# Patient Record
Sex: Female | Born: 1935 | Race: White | Hispanic: No | Smoking: Former smoker
Health system: Southern US, Community
[De-identification: ages and names within clinical notes are randomized; demographics above are authoritative.]

## PROBLEM LIST (undated history)

## (undated) DIAGNOSIS — E7800 Pure hypercholesterolemia, unspecified: Secondary | ICD-10-CM

## (undated) DIAGNOSIS — F329 Major depressive disorder, single episode, unspecified: Secondary | ICD-10-CM

## (undated) DIAGNOSIS — I4891 Unspecified atrial fibrillation: Secondary | ICD-10-CM

## (undated) DIAGNOSIS — I10 Essential (primary) hypertension: Secondary | ICD-10-CM

## (undated) DIAGNOSIS — I499 Cardiac arrhythmia, unspecified: Secondary | ICD-10-CM

## (undated) DIAGNOSIS — H3530 Unspecified macular degeneration: Secondary | ICD-10-CM

## (undated) DIAGNOSIS — F32A Depression, unspecified: Secondary | ICD-10-CM

## (undated) DIAGNOSIS — K219 Gastro-esophageal reflux disease without esophagitis: Secondary | ICD-10-CM

## (undated) DIAGNOSIS — I639 Cerebral infarction, unspecified: Secondary | ICD-10-CM

## (undated) DIAGNOSIS — G459 Transient cerebral ischemic attack, unspecified: Secondary | ICD-10-CM

## (undated) HISTORY — DX: Unspecified macular degeneration: H35.30

## (undated) HISTORY — DX: Unspecified atrial fibrillation: I48.91

## (undated) HISTORY — DX: Transient cerebral ischemic attack, unspecified: G45.9

---

## 2010-03-26 ENCOUNTER — Ambulatory Visit (HOSPITAL_COMMUNITY): Admission: RE | Admit: 2010-03-26 | Discharge: 2010-03-26 | Payer: Self-pay | Admitting: Ophthalmology

## 2010-04-30 ENCOUNTER — Ambulatory Visit (HOSPITAL_COMMUNITY): Admission: RE | Admit: 2010-04-30 | Discharge: 2010-04-30 | Payer: Self-pay | Admitting: Ophthalmology

## 2010-10-08 LAB — GLUCOSE, CAPILLARY: Glucose-Capillary: 130 mg/dL — ABNORMAL HIGH (ref 70–99)

## 2010-10-09 LAB — HEMOGLOBIN AND HEMATOCRIT, BLOOD
HCT: 41.5 % (ref 36.0–46.0)
Hemoglobin: 14.3 g/dL (ref 12.0–15.0)

## 2010-10-09 LAB — BASIC METABOLIC PANEL
BUN: 8 mg/dL (ref 6–23)
CO2: 23 mEq/L (ref 19–32)
Calcium: 9.2 mg/dL (ref 8.4–10.5)
Creatinine, Ser: 0.95 mg/dL (ref 0.4–1.2)
GFR calc Af Amer: >60 mL/min (ref >60)

## 2010-11-01 ENCOUNTER — Inpatient Hospital Stay (INDEPENDENT_AMBULATORY_CARE_PROVIDER_SITE_OTHER)
Admission: RE | Admit: 2010-11-01 | Discharge: 2010-11-01 | Disposition: A | Discharge status: Home / Self Care | Payer: Medicare Other | Source: Ambulatory Visit | Attending: Emergency Medicine | Admitting: Emergency Medicine

## 2010-11-01 DIAGNOSIS — I10 Essential (primary) hypertension: Secondary | ICD-10-CM

## 2010-11-01 DIAGNOSIS — J069 Acute upper respiratory infection, unspecified: Secondary | ICD-10-CM

## 2011-07-17 ENCOUNTER — Emergency Department (INDEPENDENT_AMBULATORY_CARE_PROVIDER_SITE_OTHER)
Admission: EM | Admit: 2011-07-17 | Discharge: 2011-07-17 | Disposition: A | Discharge status: Home / Self Care | Payer: Medicare Other | Source: Home / Self Care | Attending: Family Medicine | Admitting: Family Medicine

## 2011-07-17 ENCOUNTER — Encounter: Payer: Self-pay | Admitting: *Deleted

## 2011-07-17 ENCOUNTER — Emergency Department (INDEPENDENT_AMBULATORY_CARE_PROVIDER_SITE_OTHER): Payer: Medicare Other

## 2011-07-17 DIAGNOSIS — J069 Acute upper respiratory infection, unspecified: Secondary | ICD-10-CM

## 2011-07-17 HISTORY — DX: Cardiac arrhythmia, unspecified: I49.9

## 2011-07-17 HISTORY — DX: Essential (primary) hypertension: I10

## 2011-07-17 HISTORY — DX: Gastro-esophageal reflux disease without esophagitis: K21.9

## 2011-07-17 HISTORY — DX: Pure hypercholesterolemia, unspecified: E78.00

## 2011-07-17 HISTORY — DX: Depression, unspecified: F32.A

## 2011-07-17 HISTORY — DX: Cerebral infarction, unspecified: I63.9

## 2011-07-17 HISTORY — DX: Major depressive disorder, single episode, unspecified: F32.9

## 2011-07-17 MED ORDER — BENZONATATE 100 MG PO CAPS: 100.0000 mg | ORAL_CAPSULE | Freq: Three times a day (TID) | ORAL | Status: AC

## 2011-07-17 MED ORDER — ACETAMINOPHEN-CODEINE 120-12 MG/5ML PO SUSP: 5.0000 mL | Freq: Three times a day (TID) | ORAL | Status: AC | PRN

## 2011-07-17 NOTE — ED Provider Notes (Signed)
History     CSN: [id number]  Arrival date & time 07/17/11  1157   First MD Initiated Contact with Patient 07/17/11 1218      Chief Complaint  Patient presents with  . Cough  . Nasal Congestion    (Consider location/radiation/quality/duration/timing/severity/associated sxs/prior treatment) HPI Comments: 77 y/o female with h/o well controlled HTN, DM comes c/o of 3 days with nasal congestion and dry cough, sister also here with similar symptoms they are planing to travel by car (somebody else driving) to visit family for the holidays and wanted to be checked. Denies, fever, shortness of breath or chest pain. Good appetite drinking fluids well. Otherwise feels well. Had her flu shot this year.    Past Medical History  Diagnosis Date  . Hypertension   . Diabetes mellitus   . Irregular heart rate   . High cholesterol   . Depression   . Acid reflux   . Stroke     History reviewed. No pertinent past surgical history.  History reviewed. No pertinent family history.  History  Substance Use Topics  . Smoking status: Never Smoker   . Smokeless tobacco: Not on file  . Alcohol Use: No    OB History    Grav Para Term Preterm Abortions TAB SAB Ect Mult Living                  Review of Systems  Constitutional: Negative.   HENT: Positive for congestion and rhinorrhea. Negative for ear pain, sore throat, trouble swallowing, neck pain, voice change and sinus pressure.   Eyes: Negative for discharge.  Respiratory: Positive for cough. Negative for chest tightness, shortness of breath and wheezing.   Cardiovascular: Negative for chest pain, palpitations and leg swelling.  Gastrointestinal: Negative for nausea, vomiting, abdominal pain and diarrhea.  Musculoskeletal: Negative for myalgias and arthralgias.  Skin: Negative for rash.  Neurological: Negative for dizziness and headaches.    Allergies  Review of patient's allergies indicates no known allergies.  Home Medications    Current Outpatient Rx  Name Route Sig Dispense Refill  . ATORVASTATIN CALCIUM 10 MG PO TABS Oral Take 10 mg by mouth 1 day or 1 dose.      . OCUVITE PO TABS Oral Take 1 tablet by mouth daily.      Marland Kitchen CLOPIDOGREL BISULFATE 75 MG PO TABS Oral Take 75 mg by mouth daily.      Marland Kitchen DIGOXIN 0.25 MG PO TABS Oral Take 250 mcg by mouth daily.      . ENALAPRIL MALEATE 5 MG PO TABS Oral Take 5 mg by mouth daily.      Marland Kitchen GLUCOSAMINE-CHONDROITIN 500-400 MG PO TABS Oral Take 1 tablet by mouth 2 (two) times daily with a meal.      . METFORMIN HCL 500 MG PO TABS Oral Take 250 mg by mouth 2 (two) times daily with a meal.      . CENTRUM SILVER ULTRA WOMENS PO Oral Take by mouth.      . OMEPRAZOLE 20 MG PO CPDR Oral Take 20 mg by mouth daily.      Marland Kitchen OVER THE COUNTER MEDICATION  Took x one     . PAROXETINE HCL 20 MG PO TABS Oral Take 20 mg by mouth every morning.      . ACETAMINOPHEN-CODEINE 120-12 MG/5ML PO SUSP Oral Take 5 mLs by mouth every 8 (eight) hours as needed for pain (can use for cough). 60 mL 0  . BENZONATATE 100 MG  PO CAPS Oral Take 1 capsule (100 mg total) by mouth every 8 (eight) hours. 21 capsule 0    BP 129/89  Pulse 96  Temp(Src) 99.2 F (37.3 C) (Oral)  Resp 20  SpO2 98%  Physical Exam  Nursing note and vitals reviewed. Constitutional: She is oriented to person, place, and time. She appears well-developed and well-nourished. No distress.  HENT:  Head: Normocephalic and atraumatic.  Right Ear: External ear normal.  Left Ear: External ear normal.  Mouth/Throat: Oropharynx is clear and moist.       Mild nasal congestion with clear rhinorrhea, nasal voice. No sinus tenderness.  Eyes: Conjunctivae and EOM are normal. Pupils are equal, round, and reactive to light. Right eye exhibits no discharge. Left eye exhibits no discharge.  Neck: Neck supple. No JVD present.  Cardiovascular: Normal rate, regular rhythm and normal heart sounds.  Exam reveals no gallop and no friction rub.    Pulmonary/Chest: Effort normal and breath sounds normal. No respiratory distress. She has no wheezes. She has no rales. She exhibits no tenderness.  Musculoskeletal: She exhibits no edema.  Lymphadenopathy:    She has no cervical adenopathy.  Neurological: She is alert and oriented to person, place, and time.  Skin: No rash noted.    ED Course  Procedures (including critical care time)  Labs Reviewed - No data to display Dg Chest 2 View  07/17/2011  *RADIOLOGY REPORT*  Clinical Data: Cough and congestion  CHEST - 2 VIEW  Comparison:  none  Findings: Normal mediastinum and cardiac silhouette.  Normal pulmonary  vasculature.  No evidence of effusion, infiltrate, or pneumothorax.  No acute bony abnormality.Degenerative osteophytosis of the thoracic spine.  IMPRESSION: No acute cardiopulmonary process.  Original Report Authenticated By: Genevive Bi, M.D.     1. URI (upper respiratory infection)       MDM  Normal chest X-ray. likely viral URI. Non toxic, reassuring clinical exam. Treated symptomatically.        Sharin Grave, MD 07/17/11 2026

## 2011-07-17 NOTE — ED Notes (Signed)
Pt with c/o cough congestion onset x 3 days worse yesterday - dry hacking cough -

## 2013-07-09 ENCOUNTER — Ambulatory Visit
Admission: RE | Admit: 2013-07-09 | Discharge: 2013-07-09 | Disposition: A | Discharge status: Home / Self Care | Payer: Medicare Other | Source: Ambulatory Visit | Attending: Family Medicine | Admitting: Family Medicine

## 2013-07-09 ENCOUNTER — Other Ambulatory Visit: Payer: Self-pay | Admitting: Family Medicine

## 2013-07-09 DIAGNOSIS — M545 Low back pain: Secondary | ICD-10-CM

## 2013-08-03 ENCOUNTER — Other Ambulatory Visit: Payer: Self-pay | Admitting: Specialist

## 2013-08-03 DIAGNOSIS — N9489 Other specified conditions associated with female genital organs and menstrual cycle: Secondary | ICD-10-CM

## 2013-08-07 ENCOUNTER — Ambulatory Visit
Admission: RE | Admit: 2013-08-07 | Discharge: 2013-08-07 | Disposition: A | Discharge status: Home / Self Care | Payer: Medicare Other | Source: Ambulatory Visit | Attending: Specialist | Admitting: Specialist

## 2013-08-07 DIAGNOSIS — N9489 Other specified conditions associated with female genital organs and menstrual cycle: Secondary | ICD-10-CM

## 2013-08-23 ENCOUNTER — Other Ambulatory Visit: Payer: Medicare Other

## 2015-02-17 ENCOUNTER — Other Ambulatory Visit: Payer: Self-pay | Admitting: Family Medicine

## 2015-02-17 ENCOUNTER — Ambulatory Visit
Admission: RE | Admit: 2015-02-17 | Discharge: 2015-02-17 | Disposition: A | Discharge status: Home / Self Care | Payer: Medicare Other | Source: Ambulatory Visit | Attending: Family Medicine | Admitting: Family Medicine

## 2015-02-17 DIAGNOSIS — M25571 Pain in right ankle and joints of right foot: Secondary | ICD-10-CM

## 2017-06-11 ENCOUNTER — Encounter (HOSPITAL_COMMUNITY): Payer: Self-pay | Admitting: Emergency Medicine

## 2017-06-11 ENCOUNTER — Other Ambulatory Visit: Payer: Self-pay

## 2017-06-11 ENCOUNTER — Emergency Department (HOSPITAL_COMMUNITY)
Admission: EM | Admit: 2017-06-11 | Discharge: 2017-06-11 | Disposition: A | Discharge status: Home / Self Care | Payer: Medicare Other | Attending: Physician Assistant | Admitting: Physician Assistant

## 2017-06-11 ENCOUNTER — Emergency Department (HOSPITAL_BASED_OUTPATIENT_CLINIC_OR_DEPARTMENT_OTHER): Payer: Medicare Other

## 2017-06-11 ENCOUNTER — Ambulatory Visit (HOSPITAL_COMMUNITY)
Admission: EM | Admit: 2017-06-11 | Discharge: 2017-06-11 | Disposition: A | Discharge status: Home / Self Care | Payer: Medicare Other | Source: Home / Self Care | Attending: Family Medicine | Admitting: Family Medicine

## 2017-06-11 DIAGNOSIS — E119 Type 2 diabetes mellitus without complications: Secondary | ICD-10-CM | POA: Diagnosis not present

## 2017-06-11 DIAGNOSIS — Z7984 Long term (current) use of oral hypoglycemic drugs: Secondary | ICD-10-CM | POA: Diagnosis not present

## 2017-06-11 DIAGNOSIS — F329 Major depressive disorder, single episode, unspecified: Secondary | ICD-10-CM | POA: Diagnosis not present

## 2017-06-11 DIAGNOSIS — L03115 Cellulitis of right lower limb: Secondary | ICD-10-CM | POA: Diagnosis not present

## 2017-06-11 DIAGNOSIS — Z8673 Personal history of transient ischemic attack (TIA), and cerebral infarction without residual deficits: Secondary | ICD-10-CM | POA: Insufficient documentation

## 2017-06-11 DIAGNOSIS — R600 Localized edema: Secondary | ICD-10-CM

## 2017-06-11 DIAGNOSIS — R21 Rash and other nonspecific skin eruption: Secondary | ICD-10-CM | POA: Insufficient documentation

## 2017-06-11 DIAGNOSIS — R609 Edema, unspecified: Secondary | ICD-10-CM

## 2017-06-11 DIAGNOSIS — Z7982 Long term (current) use of aspirin: Secondary | ICD-10-CM | POA: Insufficient documentation

## 2017-06-11 DIAGNOSIS — R2243 Localized swelling, mass and lump, lower limb, bilateral: Secondary | ICD-10-CM | POA: Diagnosis not present

## 2017-06-11 DIAGNOSIS — I10 Essential (primary) hypertension: Secondary | ICD-10-CM | POA: Diagnosis not present

## 2017-06-11 DIAGNOSIS — Z87891 Personal history of nicotine dependence: Secondary | ICD-10-CM | POA: Insufficient documentation

## 2017-06-11 DIAGNOSIS — Z7902 Long term (current) use of antithrombotics/antiplatelets: Secondary | ICD-10-CM | POA: Insufficient documentation

## 2017-06-11 DIAGNOSIS — Z79899 Other long term (current) drug therapy: Secondary | ICD-10-CM | POA: Diagnosis not present

## 2017-06-11 MED ORDER — PREDNISONE 20 MG PO TABS: ORAL_TABLET | ORAL | 0 refills | Status: DC

## 2017-06-11 MED ORDER — TRIAMCINOLONE ACETONIDE 0.1 % EX CREA
TOPICAL_CREAM | Freq: Two times a day (BID) | CUTANEOUS | Status: DC
  Administered 2017-06-11: 20:00:00 via TOPICAL
  Filled 2017-06-11: qty 15

## 2017-06-11 MED ORDER — PREDNISONE 20 MG PO TABS
40.0000 mg | ORAL_TABLET | Freq: Once | ORAL | Status: AC
  Administered 2017-06-11: 40 mg via ORAL
  Filled 2017-06-11: qty 2

## 2017-06-11 NOTE — ED Notes (Signed)
Vascular tech at bedside. °

## 2017-06-11 NOTE — Progress Notes (Signed)
VASCULAR LAB PRELIMINARY  PRELIMINARY  PRELIMINARY  PRELIMINARY  Bilateral lower extremity venous duplex completed.    Preliminary report:  There is no obvious evidence of DVT or SVT noted in the visualized veins of the bilateral lower extremities.  There are enlarged inguinal lymph nodes noted bilaterally.  Gave report to Dr. Rubin PayorPickering and patient's RN.  Morgan Ross, RVT 06/11/2017, 7:18 PM

## 2017-06-11 NOTE — Discharge Instructions (Signed)
Please go to the ED so they can evaluate the leg and rule out a blood clot and treat for possible infection

## 2017-06-11 NOTE — ED Triage Notes (Addendum)
Rash on trunk and extremities for a month.  Swelling in lower extremities and redness noted last night.  Rash does itch.  Right ankle particularly bad  Right lower leg larger than left lower leg

## 2017-06-11 NOTE — ED Triage Notes (Signed)
Pt presents to ED for assessment of bilateral leg and arm swelling with redness, cellulitis, and flaky skin.  Pt states all symptoms have been going on x 1 month, c/o worsening, especially with itching.

## 2017-06-11 NOTE — ED Provider Notes (Signed)
[facility]   CSN: [id number] Arrival date & time: 06/11/17  1622     History   Chief Complaint Chief Complaint  Patient presents with  . Leg Swelling  . Rash    HPI Morgan Ross is a 83 y.o. female.  HPI  Patient is a 83 year old female presenting with rash for the last 9 months.  Patient went to urgent care today and was sent here to rule out DVT.  Patient has a rash to bilateral upper extremities lower extremities and back.  Patient reports is itchy in nature.  No systemic symptoms.  No fevers no nausea no vomiting no change in weight.  She sees a primary care physician regularly every 6 months.  Patient denies diabetes  Family member reports that they have tried multiple over-the-counter things for different amount of time but have not noticed anything that helps.  Past Medical History:  Diagnosis Date  . Acid reflux   . Atrial fibrillation (HCC)   . Depression   . Diabetes mellitus   . High cholesterol   . Hypertension   . Irregular heart rate   . Macular degeneration   . Memory changes   . Stroke (HCC)   . TIA (transient ischemic attack)     There are no active problems to display for this patient.   History reviewed. No pertinent surgical history.  OB History    No data available       Home Medications    Prior to Admission medications   Medication Sig Start Date End Date Taking? Authorizing Provider  aspirin 81 MG tablet Take 81 mg by mouth daily.    [provider]  atorvastatin (LIPITOR) 10 MG tablet Take 10 mg by mouth 1 day or 1 dose.      [provider]  clopidogrel (PLAVIX) 75 MG tablet Take 75 mg by mouth daily.      [provider]  digoxin (LANOXIN) 0.25 MG tablet Take 125 mcg by mouth daily.     [provider]  donepezil (ARICEPT) 10 MG tablet Take 10 mg by mouth at bedtime.    [provider]  enalapril (VASOTEC) 5 MG tablet Take 5  mg by mouth 2 (two) times daily.     [provider]  glucosamine-chondroitin 500-400 MG tablet Take 1 tablet by mouth 2 (two) times daily with a meal.      [provider]  LOSARTAN POTASSIUM PO Take by mouth.    [provider]  Memantine HCl-Donepezil HCl (NAMZARIC PO) Take by mouth.    [provider]  metFORMIN (GLUCOPHAGE) 500 MG tablet Take 250 mg by mouth 2 (two) times daily with a meal.      [provider]  Multiple Vitamins-Minerals (CENTRUM SILVER ULTRA WOMENS PO) Take by mouth.      [provider]  omeprazole (PRILOSEC) 20 MG capsule Take 20 mg by mouth daily.      [provider]  PARoxetine (PAXIL) 20 MG tablet Take 20 mg by mouth every morning.      [provider]    Family History Family History  Problem Relation Age of Onset  . Hypertension Sister   . Hypertension Sister     Social History Social History   Tobacco Use  . Smoking status: Former Games developermoker  . Smokeless tobacco: Never Used  Substance Use Topics  . Alcohol use: No  . Drug use: No  Allergies   Patient has no known allergies.   Review of Systems Review of Systems  Constitutional: Negative for activity change.  Respiratory: Negative for shortness of breath.   Cardiovascular: Negative for chest pain.  Gastrointestinal: Negative for abdominal pain.     Physical Exam Updated Vital Signs BP (!) 165/77   Pulse 77   Temp 98 F (36.7 C) (Oral)   Resp 16   SpO2 100%   Physical Exam  Constitutional: She is oriented to person, place, and time. She appears well-developed and well-nourished.  HENT:  Head: Normocephalic and atraumatic.  Eyes: Right eye exhibits no discharge.  Cardiovascular: Normal rate.  Pulmonary/Chest: Effort normal and breath sounds normal. She has no wheezes.  Neurological: She is oriented to person, place, and time.  Skin: Skin is warm and dry. She is not diaphoretic.  Multiple areas of  erythematous base with scaly top, clear excoriations.  Psychiatric: She has a normal mood and affect.  Nursing note and vitals reviewed.        ED Treatments / Results  Labs (all labs ordered are listed, but only abnormal results are displayed) Labs Reviewed - No data to display  EKG  EKG Interpretation None       Radiology No results found.  Procedures Procedures (including critical care time)  Medications Ordered in ED Medications - No data to display   Initial Impression / Assessment and Plan / ED Course  I have reviewed the triage vital signs and the nursing notes.  Pertinent labs & imaging results that were available during my care of the patient were reviewed by me and considered in my medical decision making (see chart for details).     Patient is a 83 year old female presenting with rash for the last 9 months.  Patient went to urgent care today and was sent here to rule out DVT.  Patient has a rash to bilateral upper extremities lower extremities and back.  Patient reports is itchy in nature.  No systemic symptoms.  No fevers no nausea no vomiting no change in weight.  She sees a primary care physician regularly every 6 months.  Patient denies diabetes  Family member reports that they have tried multiple over-the-counter things for different amount of time but have not noticed anything that helps.  7:35 PM This rash appears very chronic.  We will give a mild steroid burst, patient c says that she does not have any diabetes.  Will give topical steroid as well.  We will have her follow-up with her primary care physician.  Told family members to have to call dermatology on Monday morning.  Looks like an  dermatitis, however there are other concerns in this age group such as cutaneous lymphoma etc.  Final Clinical Impressions(s) / ED Diagnoses   Final diagnoses:  None    ED Discharge Orders    None       Abelino DerrickMackuen, Jackqulyn Mendel Lyn, MD 06/11/17 1936

## 2017-06-11 NOTE — Discharge Instructions (Signed)
Please take the steroids to help with your rash.  Is important to note that these medications can make your glucose high, so keep a close watch.  In addition you can use topical medication on your skin.  It is important to make a follow-up with your primary care physician in the next week or 2.  In addition you need to make an appointment with dermatologist as soon as possible.

## 2017-06-11 NOTE — ED Provider Notes (Signed)
[facility]    CSN: [id number] Arrival date & time: 06/11/17  1459     History   Chief Complaint Chief Complaint  Patient presents with  . Leg Swelling    HPI Morgan Ross is a 83 y.o. female.   Who presents with severe rash, redness, pain and swelling of her right leg. Onset rash x 1 month, redness and swelling of the right lower leg x 2 days per family member. Denies dysnpnea or chest pain. No new medications. No fevers or chills.       Past Medical History:  Diagnosis Date  . Acid reflux   . Atrial fibrillation (HCC)   . Depression   . Diabetes mellitus   . High cholesterol   . Hypertension   . Irregular heart rate   . Macular degeneration   . Memory changes   . Stroke (HCC)   . TIA (transient ischemic attack)     There are no active problems to display for this patient.   History reviewed. No pertinent surgical history.  OB History    No data available       Home Medications    Prior to Admission medications   Medication Sig Start Date End Date Taking? Authorizing Provider  LOSARTAN POTASSIUM PO Take by mouth.   Yes [provider]  Memantine HCl-Donepezil HCl (NAMZARIC PO) Take by mouth.   Yes [provider]  aspirin 81 MG tablet Take 81 mg by mouth daily.    [provider]  atorvastatin (LIPITOR) 10 MG tablet Take 10 mg by mouth 1 day or 1 dose.      [provider]  clopidogrel (PLAVIX) 75 MG tablet Take 75 mg by mouth daily.      [provider]  digoxin (LANOXIN) 0.25 MG tablet Take 125 mcg by mouth daily.     [provider]  donepezil (ARICEPT) 10 MG tablet Take 10 mg by mouth at bedtime.    [provider]  enalapril (VASOTEC) 5 MG tablet Take 5 mg by mouth 2 (two) times daily.     [provider]  glucosamine-chondroitin 500-400 MG tablet Take 1 tablet by mouth 2 (two) times daily with a meal.      [provider]  metFORMIN (GLUCOPHAGE) 500  MG tablet Take 250 mg by mouth 2 (two) times daily with a meal.      [provider]  Multiple Vitamins-Minerals (CENTRUM SILVER ULTRA WOMENS PO) Take by mouth.      [provider]  omeprazole (PRILOSEC) 20 MG capsule Take 20 mg by mouth daily.      [provider]  PARoxetine (PAXIL) 20 MG tablet Take 20 mg by mouth every morning.      [provider]    Family History Family History  Problem Relation Age of Onset  . Hypertension Sister   . Hypertension Sister     Social History Social History   Tobacco Use  . Smoking status: Former Smoker  Substance Use Topics  . Alcohol use: No  . Drug use: No     Allergies   Patient has no known allergies.   Review of Systems Review of Systems   Physical Exam Triage Vital Signs ED Triage Vitals  Enc Vitals Group     BP      Pulse      Resp      Temp      Temp src  SpO2      Weight      Height      Head Circumference      Peak Flow      Pain Score      Pain Loc      Pain Edu?      Excl. in GC?    No data found.  Updated Vital Signs There were no vitals taken for this visit.  Visual Acuity Right Eye Distance:   Left Eye Distance:   Bilateral Distance:    Right Eye Near:   Left Eye Near:    Bilateral Near:     Physical Exam  Constitutional: Morgan Ross appears well-developed and well-nourished.  Musculoskeletal: Morgan Ross exhibits edema.  Neurological: Morgan Ross is alert.  Skin: Skin is warm. Rash noted. There is erythema.  Psychiatric:  Right lower leg and calf with moderate edema when compared to left leg, erythema overlying erythematous thick scaly rash with excoriations throughout entire body. No pain to palpation of the calf and negative Homan's  Nursing note and vitals reviewed.    UC Treatments / Results  Labs (all labs ordered are listed, but only abnormal results are displayed) Labs Reviewed - No data to display  EKG  EKG Interpretation None       Radiology No  results found.  Procedures Procedures (including critical care time)  Medications Ordered in UC Medications - No data to display   Initial Impression / Assessment and Plan / UC Course  I have reviewed the triage vital signs and the nursing notes.  Pertinent labs & imaging results that were available during my care of the patient were reviewed by me and considered in my medical decision making (see chart for details).     83 yo with asymmetric calf/LE swelling, erythema and severe rash. In theory needs a doppler and further evaluation. Will send to the ED for further evaluation.   Final Clinical Impressions(s) / UC Diagnoses   Final diagnoses:  None    ED Discharge Orders    None       Controlled Substance Prescriptions [place] Controlled Substance Registry consulted? Not Applicable   Riki SheerYoung, Mahogony Gilchrest G, PA-C 06/11/17 1551

## 2017-08-09 ENCOUNTER — Emergency Department (HOSPITAL_COMMUNITY): Payer: Medicare Other

## 2017-08-09 ENCOUNTER — Observation Stay (HOSPITAL_COMMUNITY)
Admission: EM | Admit: 2017-08-09 | Discharge: 2017-08-13 | Disposition: A | Discharge status: Skilled Nursing Facility | Payer: Medicare Other | Attending: Internal Medicine | Admitting: Internal Medicine

## 2017-08-09 DIAGNOSIS — B9620 Unspecified Escherichia coli [E. coli] as the cause of diseases classified elsewhere: Secondary | ICD-10-CM | POA: Insufficient documentation

## 2017-08-09 DIAGNOSIS — K219 Gastro-esophageal reflux disease without esophagitis: Secondary | ICD-10-CM | POA: Insufficient documentation

## 2017-08-09 DIAGNOSIS — E7800 Pure hypercholesterolemia, unspecified: Secondary | ICD-10-CM | POA: Insufficient documentation

## 2017-08-09 DIAGNOSIS — Z87891 Personal history of nicotine dependence: Secondary | ICD-10-CM | POA: Diagnosis not present

## 2017-08-09 DIAGNOSIS — Z8673 Personal history of transient ischemic attack (TIA), and cerebral infarction without residual deficits: Secondary | ICD-10-CM | POA: Insufficient documentation

## 2017-08-09 DIAGNOSIS — I6782 Cerebral ischemia: Secondary | ICD-10-CM | POA: Diagnosis not present

## 2017-08-09 DIAGNOSIS — R296 Repeated falls: Secondary | ICD-10-CM | POA: Diagnosis not present

## 2017-08-09 DIAGNOSIS — I10 Essential (primary) hypertension: Secondary | ICD-10-CM

## 2017-08-09 DIAGNOSIS — E119 Type 2 diabetes mellitus without complications: Secondary | ICD-10-CM | POA: Insufficient documentation

## 2017-08-09 DIAGNOSIS — Z66 Do not resuscitate: Secondary | ICD-10-CM | POA: Insufficient documentation

## 2017-08-09 DIAGNOSIS — R531 Weakness: Secondary | ICD-10-CM | POA: Diagnosis not present

## 2017-08-09 DIAGNOSIS — N390 Urinary tract infection, site not specified: Secondary | ICD-10-CM | POA: Insufficient documentation

## 2017-08-09 DIAGNOSIS — J322 Chronic ethmoidal sinusitis: Secondary | ICD-10-CM | POA: Diagnosis not present

## 2017-08-09 DIAGNOSIS — R29898 Other symptoms and signs involving the musculoskeletal system: Secondary | ICD-10-CM

## 2017-08-09 DIAGNOSIS — G3189 Other specified degenerative diseases of nervous system: Secondary | ICD-10-CM | POA: Diagnosis not present

## 2017-08-09 DIAGNOSIS — I4891 Unspecified atrial fibrillation: Secondary | ICD-10-CM | POA: Diagnosis not present

## 2017-08-09 DIAGNOSIS — F329 Major depressive disorder, single episode, unspecified: Secondary | ICD-10-CM | POA: Insufficient documentation

## 2017-08-09 DIAGNOSIS — Z7982 Long term (current) use of aspirin: Secondary | ICD-10-CM | POA: Diagnosis not present

## 2017-08-09 DIAGNOSIS — F0390 Unspecified dementia without behavioral disturbance: Secondary | ICD-10-CM

## 2017-08-09 DIAGNOSIS — N1330 Unspecified hydronephrosis: Secondary | ICD-10-CM | POA: Diagnosis not present

## 2017-08-09 DIAGNOSIS — Z23 Encounter for immunization: Secondary | ICD-10-CM | POA: Diagnosis not present

## 2017-08-09 DIAGNOSIS — E876 Hypokalemia: Secondary | ICD-10-CM

## 2017-08-09 DIAGNOSIS — E785 Hyperlipidemia, unspecified: Secondary | ICD-10-CM | POA: Diagnosis not present

## 2017-08-09 DIAGNOSIS — G459 Transient cerebral ischemic attack, unspecified: Secondary | ICD-10-CM | POA: Diagnosis present

## 2017-08-09 DIAGNOSIS — R41841 Cognitive communication deficit: Secondary | ICD-10-CM | POA: Insufficient documentation

## 2017-08-09 DIAGNOSIS — Z79899 Other long term (current) drug therapy: Secondary | ICD-10-CM | POA: Insufficient documentation

## 2017-08-09 DIAGNOSIS — N19 Unspecified kidney failure: Secondary | ICD-10-CM

## 2017-08-09 DIAGNOSIS — N179 Acute kidney failure, unspecified: Secondary | ICD-10-CM | POA: Diagnosis not present

## 2017-08-09 LAB — CBC WITH DIFFERENTIAL/PLATELET
Basophils Absolute: 0 10*3/uL (ref 0.0–0.1)
Basophils Relative: 0 %
Eosinophils Absolute: 0.2 10*3/uL (ref 0.0–0.7)
Eosinophils Relative: 1 %
HCT: 37.1 % (ref 36.0–46.0)
Hemoglobin: 12.4 g/dL (ref 12.0–15.0)
Lymphocytes Relative: 14 %
Lymphs Abs: 1.7 10*3/uL (ref 0.7–4.0)
MCH: 29.5 pg (ref 26.0–34.0)
MCHC: 33.4 g/dL (ref 30.0–36.0)
MCV: 88.3 fL (ref 78.0–100.0)
Monocytes Absolute: 1.4 10*3/uL — ABNORMAL HIGH (ref 0.1–1.0)
Monocytes Relative: 11 %
Neutro Abs: 8.7 10*3/uL — ABNORMAL HIGH (ref 1.7–7.7)
Neutrophils Relative %: 74 %
Platelets: 158 10*3/uL (ref 150–400)
RBC: 4.2 MIL/uL (ref 3.87–5.11)
RDW: 14.7 % (ref 11.5–15.5)
WBC: 11.9 10*3/uL — ABNORMAL HIGH (ref 4.0–10.5)

## 2017-08-09 LAB — BASIC METABOLIC PANEL
Anion gap: 8 (ref 5–15)
BUN: 25 mg/dL — ABNORMAL HIGH (ref 6–20)
CO2: 26 mmol/L (ref 22–32)
Calcium: 8 mg/dL — ABNORMAL LOW (ref 8.9–10.3)
Chloride: 110 mmol/L (ref 101–111)
Creatinine, Ser: 1.88 mg/dL — ABNORMAL HIGH (ref 0.44–1.00)
GFR calc Af Amer: 26 mL/min — ABNORMAL LOW (ref >60)
GFR calc non Af Amer: 22 mL/min — ABNORMAL LOW (ref >60)
Glucose, Bld: 93 mg/dL (ref 65–99)
Potassium: 2.9 mmol/L — ABNORMAL LOW (ref 3.5–5.1)
Sodium: 144 mmol/L (ref 135–145)

## 2017-08-09 LAB — URINALYSIS, ROUTINE W REFLEX MICROSCOPIC
Bilirubin Urine: NEGATIVE
Glucose, UA: NEGATIVE mg/dL
Ketones, ur: 5 mg/dL — AB
Nitrite: NEGATIVE
Protein, ur: 100 mg/dL — AB
Specific Gravity, Urine: 1.02 (ref 1.005–1.030)
pH: 6 (ref 5.0–8.0)

## 2017-08-09 MED ORDER — ACETAMINOPHEN 325 MG PO TABS: 650.0000 mg | ORAL_TABLET | ORAL | Status: DC | PRN

## 2017-08-09 MED ORDER — MEMANTINE HCL ER 28 MG PO CP24
28.0000 mg | ORAL_CAPSULE | Freq: Every day | ORAL | Status: DC
  Administered 2017-08-09 – 2017-08-12 (×4): 28 mg via ORAL
  Filled 2017-08-09 (×4): qty 1

## 2017-08-09 MED ORDER — MAGNESIUM SULFATE IN D5W 1-5 GM/100ML-% IV SOLN
1.0000 g | Freq: Once | INTRAVENOUS | Status: AC
  Administered 2017-08-09: 1 g via INTRAVENOUS
  Filled 2017-08-09: qty 100

## 2017-08-09 MED ORDER — SODIUM CHLORIDE 0.9 % IV BOLUS (SEPSIS)
1000.0000 mL | Freq: Once | INTRAVENOUS | Status: AC
  Administered 2017-08-09: 1000 mL via INTRAVENOUS

## 2017-08-09 MED ORDER — ACETAMINOPHEN 650 MG RE SUPP: 650.0000 mg | RECTAL | Status: DC | PRN

## 2017-08-09 MED ORDER — STROKE: EARLY STAGES OF RECOVERY BOOK
Freq: Once | Status: DC
  Filled 2017-08-09: qty 1

## 2017-08-09 MED ORDER — ASPIRIN EC 81 MG PO TBEC
81.0000 mg | DELAYED_RELEASE_TABLET | Freq: Every day | ORAL | Status: DC
  Administered 2017-08-09 – 2017-08-13 (×4): 81 mg via ORAL
  Filled 2017-08-09 (×4): qty 1

## 2017-08-09 MED ORDER — MEMANTINE HCL-DONEPEZIL HCL 28-10 MG PO CP24
1.0000 | ORAL_CAPSULE | Freq: Every day | ORAL | Status: DC
Start: 2017-08-09 — End: 2017-08-09

## 2017-08-09 MED ORDER — ASPIRIN 81 MG PO CHEW
324.0000 mg | CHEWABLE_TABLET | Freq: Once | ORAL | Status: AC
  Administered 2017-08-09: 324 mg via ORAL
  Filled 2017-08-09: qty 4

## 2017-08-09 MED ORDER — POTASSIUM CHLORIDE CRYS ER 20 MEQ PO TBCR
60.0000 meq | EXTENDED_RELEASE_TABLET | Freq: Once | ORAL | Status: AC
  Administered 2017-08-09: 60 meq via ORAL
  Filled 2017-08-09: qty 3

## 2017-08-09 MED ORDER — ACETAMINOPHEN 160 MG/5ML PO SOLN: 650.0000 mg | ORAL | Status: DC | PRN

## 2017-08-09 MED ORDER — HEPARIN SODIUM (PORCINE) 5000 UNIT/ML IJ SOLN
5000.0000 [IU] | Freq: Three times a day (TID) | INTRAMUSCULAR | Status: DC
  Administered 2017-08-09 – 2017-08-13 (×12): 5000 [IU] via SUBCUTANEOUS
  Filled 2017-08-09 (×12): qty 1

## 2017-08-09 MED ORDER — DONEPEZIL HCL 10 MG PO TABS
10.0000 mg | ORAL_TABLET | Freq: Every day | ORAL | Status: DC
  Administered 2017-08-09 – 2017-08-12 (×4): 10 mg via ORAL
  Filled 2017-08-09 (×4): qty 1

## 2017-08-09 MED ORDER — SODIUM CHLORIDE 0.9 % IV SOLN
INTRAVENOUS | Status: DC
  Administered 2017-08-09 – 2017-08-12 (×5): via INTRAVENOUS

## 2017-08-09 NOTE — H&P (Addendum)
History and Physical    Morgan Ross ZOX:[id number] DOB: 1923-05-10 DOA: 08/09/2017  I have briefly reviewed the patient's [facility]  PCP: Clovis RileyMitchell, L.August Saucerean, MD  Patient coming from: home  Chief Complaint: Recurrent falls at home  HPI: Morgan S Zufall is a 84 y.o. female with medical history significant of ?A. Fib, hypertension, hyperlipidemia, advanced dementia, presents to the hospital with chief complaint of recurrent falls at home.  Patient has underlying dementia and is unable to contribute to the story, sister who is the main caregiver is at bedside and she tells me that patient has been having recurrent falls over the last couple of days.  Patient's sister also noticed that her right leg has been weaker in the last couple of days, however she is not exactly sure when this started.  Patient is pleasantly demented, and has no complaints, denies any pain.  She does state that her right leg feels heavier than normal.  There are no reported fever or chills, no abdominal pain nausea vomiting or diarrhea.  No reported chest pains or palpitations.  No reported syncopal episodes it appears that she has mechanical falls due to right lower extremity weakness  ED Course: In the emergency room her patient's vital signs are stable, her blood work shows a creatinine of 1.88 from prior normal values, potassium of 2.9, CT scan of the brain without abnormalities.  EKG shows sinus rhythm.  EDP discussed with neurology who recommended to be admitted for MRI to rule out stroke, however there may be other issues that contribute to her weakness, and [facility]  Review of Systems: As per HPI otherwise 10 point review of systems negative.   Past Medical History:  Diagnosis Date  . Acid reflux   . Atrial fibrillation (HCC)   . Depression   . Diabetes mellitus   . High cholesterol   . Hypertension   . Irregular heart rate   . Macular  degeneration   . Memory changes   . Stroke (HCC)   . TIA (transient ischemic attack)     No past surgical history on file.   reports that she has quit smoking. she has never used smokeless tobacco. She reports that she does not drink alcohol or use drugs.  No Known Allergies  Family History  Problem Relation Age of Onset  . Hypertension Sister   . Hypertension Sister     Prior to Admission medications   Medication Sig Start Date End Date Taking? Authorizing Provider  aspirin 81 MG tablet Take 81 mg by mouth daily.   Yes [provider]  Calcium Carbonate-Vit D-Min (CALCIUM 1200 PO) Take 1,200 mg by mouth daily.   Yes [provider]  losartan (COZAAR) 50 MG tablet Take 50 mg by mouth daily. 06/08/17  Yes [provider]  Multiple Vitamins-Minerals (PRESERVISION AREDS PO) Take 1 tablet by mouth daily.   Yes [provider]  NAMZARIC 28-10 MG CP24 Take 1 tablet by mouth daily. 06/03/17  Yes [provider]  predniSONE (DELTASONE) 20 MG tablet Day 1 and 2: Take 3 tabs  Day 3-5: Take 2 tabs.  Day 5-8: take 1 tab Patient not taking: Reported on 08/09/2017 06/11/17   Abelino DerrickMackuen, Courteney Lyn, MD    Physical Exam: Vitals:   08/09/17 1204 08/09/17 1216 08/09/17 1344 08/09/17 1613  BP:  (!) 147/62 138/68 (!) 139/46  Pulse:  67 66 65  Resp:  14 16  16  Temp:  (!) 97.5 F (36.4 C)    TempSrc:  Oral    SpO2: 99% 98% 99% 96%     Constitutional: NAD, calm, comfortable Eyes: PERRL, lids and conjunctivae normal ENMT: Mucous membranes are moist.  Neck: normal, supple, no masses Respiratory: clear to auscultation bilaterally, no wheezing, no crackles. Normal respiratory effort. No accessory muscle use.  Cardiovascular: Regular rate and rhythm, no murmurs / rubs / gallops. No extremity edema. 2+ pedal pulses.  Abdomen: no tenderness, no masses palpated. Bowel sounds positive.  Musculoskeletal: no clubbing / cyanosis.  Decreased muscle tone.    Skin: erythematous rash on bilateral lower extremity and upper extremity (chronic, for several years per patient sister) Neurologic: CN 2-12 grossly intact.  Equal strength 5 out of 5 in upper extremities, 5 out of 5 in left lower extremity, 3 out of 5 in right lower extremity, decreased sensation right foot as well Psychiatric:  Alert to person only  Labs on Admission: I have personally reviewed following labs and imaging studies  CBC: Recent Labs  Lab 08/09/17 1413  WBC 11.9*  NEUTROABS 8.7*  HGB 12.4  HCT 37.1  MCV 88.3  PLT 158   Basic Metabolic Panel: Recent Labs  Lab 08/09/17 1413  NA 144  K 2.9*  CL 110  CO2 26  GLUCOSE 93  BUN 25*  CREATININE 1.88*  CALCIUM 8.0*   GFR: CrCl cannot be calculated (Unknown ideal weight.). Liver Function Tests: No results for input(s): AST, ALT, ALKPHOS, BILITOT, PROT, ALBUMIN in the last 168 hours. No results for input(s): LIPASE, AMYLASE in the last 168 hours. No results for input(s): AMMONIA in the last 168 hours. Coagulation Profile: No results for input(s): INR, PROTIME in the last 168 hours. Cardiac Enzymes: No results for input(s): CKTOTAL, CKMB, CKMBINDEX, TROPONINI in the last 168 hours. BNP (last 3 results) No results for input(s): PROBNP in the last 8760 hours. HbA1C: No results for input(s): HGBA1C in the last 72 hours. CBG: No results for input(s): GLUCAP in the last 168 hours. Lipid Profile: No results for input(s): CHOL, HDL, LDLCALC, TRIG, CHOLHDL, LDLDIRECT in the last 72 hours. Thyroid Function Tests: No results for input(s): TSH, T4TOTAL, FREET4, T3FREE, THYROIDAB in the last 72 hours. Anemia Panel: No results for input(s): VITAMINB12, FOLATE, FERRITIN, TIBC, IRON, RETICCTPCT in the last 72 hours. Urine analysis: No results found for: COLORURINE, APPEARANCEUR, LABSPEC, PHURINE, GLUCOSEU, HGBUR, BILIRUBINUR, KETONESUR, PROTEINUR, UROBILINOGEN, NITRITE, LEUKOCYTESUR   Radiological Exams on Admission: Ct  Head Wo Contrast  Result Date: 08/09/2017 CLINICAL DATA:  Multiple unwitnessed falls over the last 3 days. History of dementia. EXAM: CT HEAD WITHOUT CONTRAST TECHNIQUE: Contiguous axial images were obtained from the base of the skull through the vertex without intravenous contrast. COMPARISON:  None. FINDINGS: Brain: Small region of poor gray-white differentiation in the right frontal lobe for example image 9/2 compatible with age indeterminate but more likely chronic infarct. Left thalamic remote lacunar infarct 0.8 by 0.5 cm on image 15/2. Otherwise, The brainstem, cerebellum, cerebral peduncles, thalami, basal ganglia, basilar cisterns, and ventricular system appear within normal limits. Periventricular white matter and corona radiata hypodensities favor chronic ischemic microvascular white matter disease. No intracranial hemorrhage, mass lesion, or acute CVA. Vascular: There is atherosclerotic calcification of the cavernous carotid arteries bilaterally. Skull: Unremarkable Sinuses/Orbits: Complete opacification of the visualized part of the left maxillary sinus. Opacification of multiple ethmoid air cells. Complete opacification of the visualized part of the left frontal sinus. Other: No supplemental non-categorized  findings. IMPRESSION: 1. No acute intracranial hemorrhage or specific acute intracranial findings. 2. Remote lacunar infarct in the left thalamus. Small region of poor gray-white differentiation in the right frontal lobe is probably a small remote infarct but is essentially age indeterminate. 3. Complete opacification of the visualized part of the left maxillary sinus and also of the left frontal sinus. This could reflect acute or chronic sinusitis. Chronic ethmoid sinusitis is also present. Electronically Signed   By: Gaylyn Rong M.D.   On: 08/09/2017 15:17    EKG: Independently reviewed.  Sinus rhythm  Assessment/Plan Active Problems:   TIA (transient ischemic attack)   HTN  (hypertension)   AKI (acute kidney injury) (HCC)   Right leg weakness   Dementia   Right lower extremity weakness/recurrent falls /concern for stroke -Unclear timeline when this started, patient's sister just noticed this in the last couple of days however patient does have a history of prior falls, most recent one about a month ago.  Will obtain an MRI of the brain to rule out stroke, I would not order 2D echo, carotids at this point, if the MRI is positive then will complete the stroke workup and consult neurology -Continue home aspirin -I see A. fib as part of her medical history however I have no records of that.  She is in sinus here.  Monitor on telemetry.  I do not think she is a good anticoagulation candidate given advanced age/dementia as well as recurrent falls -Urinalysis pending, if positive for UTI will treat  Acute kidney injury -Unclear baseline creatinine, it was normal back in 2011 at 0.95, gentle hydration overnight and repeat renal function in the morning -Obtain renal ultrasound to assess chronic component -Hold Cozaar  Hypertension -Hold Cozaar, monitor blood pressure and if persistently high will need alternative agent  Hypokalemia -Potassium repleted by EDP, will recheck blood work tomorrow morning  Dementia -Continue home medications   DVT prophylaxis: Heparin Code Status: DNR Family Communication: Discussed with sister at bedside Disposition Plan: Admit to telemetry, home versus SNF on discharge Consults called: None (EDP discussed with neurology Dr. Otelia Limes over the phone)    Admission status: Observation   At the point of initial evaluation, it is my clinical opinion that admission for OBSERVATION is reasonable and necessary because the patient's presenting complaints in the context of their chronic conditions represent sufficient risk of deterioration or significant morbidity to constitute reasonable grounds for close observation in the hospital  setting, but that the patient may be medically stable for discharge from the hospital within 24 to 48 hours.   Pamella Pert, MD [facility] Pager [phone]  If 7PM-7AM, please contact night-coverage [email or web address] Password [facility]  08/09/2017, 4:46 PM

## 2017-08-09 NOTE — ED Provider Notes (Addendum)
[facility]-EMERGENCY DEPT Provider Note   CSN: [id number] Arrival date & time: 08/09/17  1201     History   Chief Complaint Chief Complaint  Patient presents with  . Fall    HPI Morgan Ross is a 84 y.o. female.  HPI   84 year old female presenting with her sister for evaluation of multiple falls.  She is fallen 3 times in the past day.  With more than a month since her last fall otherwise.  Patient has some baseline dementia and her sister reports that she seems to be more or less at her baseline currently.  Patient is unable to tell me how/why she is falling.  She is aware that she is falling but unsure if she is tripping, otherwise losing her balance or having syncopal episodes.  No reported loss of consciousness.  She denies any acute pain.  Sister reports poor appetite over the past day or so.  No recent medication changes.  No fevers.  Past Medical History:  Diagnosis Date  . Acid reflux   . Atrial fibrillation (HCC)   . Depression   . Diabetes mellitus   . High cholesterol   . Hypertension   . Irregular heart rate   . Macular degeneration   . Memory changes   . Stroke (HCC)   . TIA (transient ischemic attack)     There are no active problems to display for this patient.   No past surgical history on file.  OB History    No data available       Home Medications    Prior to Admission medications   Medication Sig Start Date End Date Taking? Authorizing Provider  aspirin 81 MG tablet Take 81 mg by mouth daily.   Yes [provider]  Calcium Carbonate-Vit D-Min (CALCIUM 1200 PO) Take 1,200 mg by mouth daily.   Yes [provider]  losartan (COZAAR) 50 MG tablet Take 50 mg by mouth daily. 06/08/17  Yes [provider]  Multiple Vitamins-Minerals (PRESERVISION AREDS PO) Take 1 tablet by mouth daily.   Yes [provider]  NAMZARIC 28-10 MG CP24 Take 1 tablet by mouth daily. 06/03/17  Yes [provider]  predniSONE (DELTASONE) 20 MG tablet Day 1 and 2: Take 3 tabs  Day 3-5: Take 2 tabs.  Day 5-8: take 1 tab Patient not taking: Reported on 08/09/2017 06/11/17   Abelino DerrickMackuen, Courteney Lyn, MD    Family History Family History  Problem Relation Age of Onset  . Hypertension Sister   . Hypertension Sister     Social History Social History   Tobacco Use  . Smoking status: Former Games developermoker  . Smokeless tobacco: Never Used  Substance Use Topics  . Alcohol use: No  . Drug use: No     Allergies   Patient has no known allergies.   Review of Systems Review of Systems  All systems reviewed and negative, other than as noted in HPI.  Physical Exam Updated Vital Signs BP 138/68   Pulse 66   Temp (!) 97.5 F (36.4 C) (Oral)   Resp 16   SpO2 99%   Physical Exam  Constitutional: She appears well-developed and well-nourished. No distress.  HENT:  Head: Normocephalic and atraumatic.  Eyes: Conjunctivae are normal. Right eye exhibits no discharge. Left eye exhibits no discharge.  Neck: Neck supple.  Cardiovascular: Normal rate, regular rhythm and normal heart sounds. Exam reveals no gallop and no friction rub.  No murmur heard.  Pulmonary/Chest: Effort normal and breath sounds normal. No respiratory distress.  Abdominal: Soft. She exhibits no distension. There is no tenderness.  Musculoskeletal: She exhibits no edema or tenderness.  Neurological: She is alert.  Disoriented to place and time.  Cranial nerves II through XII are intact.  Strength is 5 out of 5 bilateral upper extremities.  Strength is 3-4 out of 5 right lower extremity.  5 out of 5 left lower extremity.  Skin: Skin is warm and dry.  Chronic appearing rash/dermatitis.  Scattered erythema.  Scaling.  Some excoriated regions.  Pretty diffuse but most noticeable on extremities.  Psychiatric: She has a normal mood and affect. Her behavior is normal. Thought content normal.  Nursing note and vitals  reviewed.    ED Treatments / Results  Labs (all labs ordered are listed, but only abnormal results are displayed) Labs Reviewed  CBC WITH DIFFERENTIAL/PLATELET - Abnormal; Notable for the following components:      Result Value   WBC 11.9 (*)    Neutro Abs 8.7 (*)    Monocytes Absolute 1.4 (*)    All other components within normal limits  BASIC METABOLIC PANEL - Abnormal; Notable for the following components:   Potassium 2.9 (*)    BUN 25 (*)    Creatinine, Ser 1.88 (*)    Calcium 8.0 (*)    GFR calc non Af Amer 22 (*)    GFR calc Af Amer 26 (*)    All other components within normal limits  URINALYSIS, ROUTINE W REFLEX MICROSCOPIC    EKG  EKG Interpretation  Date/Time:  Tuesday August 09 2017 16:12:56 EST Ventricular Rate:  74 PR Interval:    QRS Duration: 105 QT Interval:  460 QTC Calculation: 511 R Axis:   19 Text Interpretation:  Sinus arrhythmia Minimal ST depression, lateral leads Confirmed by Raeford Razor [phone]) on 08/09/2017 4:20:05 PM       Radiology Ct Head Wo Contrast  Result Date: 08/09/2017 CLINICAL DATA:  Multiple unwitnessed falls over the last 3 days. History of dementia. EXAM: CT HEAD WITHOUT CONTRAST TECHNIQUE: Contiguous axial images were obtained from the base of the skull through the vertex without intravenous contrast. COMPARISON:  None. FINDINGS: Brain: Small region of poor gray-white differentiation in the right frontal lobe for example image 9/2 compatible with age indeterminate but more likely chronic infarct. Left thalamic remote lacunar infarct 0.8 by 0.5 cm on image 15/2. Otherwise, The brainstem, cerebellum, cerebral peduncles, thalami, basal ganglia, basilar cisterns, and ventricular system appear within normal limits. Periventricular white matter and corona radiata hypodensities favor chronic ischemic microvascular white matter disease. No intracranial hemorrhage, mass lesion, or acute CVA. Vascular: There is atherosclerotic calcification  of the cavernous carotid arteries bilaterally. Skull: Unremarkable Sinuses/Orbits: Complete opacification of the visualized part of the left maxillary sinus. Opacification of multiple ethmoid air cells. Complete opacification of the visualized part of the left frontal sinus. Other: No supplemental non-categorized findings. IMPRESSION: 1. No acute intracranial hemorrhage or specific acute intracranial findings. 2. Remote lacunar infarct in the left thalamus. Small region of poor gray-white differentiation in the right frontal lobe is probably a small remote infarct but is essentially age indeterminate. 3. Complete opacification of the visualized part of the left maxillary sinus and also of the left frontal sinus. This could reflect acute or chronic sinusitis. Chronic ethmoid sinusitis is also present. Electronically Signed   By: Gaylyn Rong M.D.   On: 08/09/2017 15:17    Procedures Procedures (including critical care time)  Medications Ordered in ED Medications - No data to display   Initial Impression / Assessment and Plan / ED Course  I have reviewed the triage vital signs and the nursing notes.  Pertinent labs & imaging results that were available during my care of the patient were reviewed by me and considered in my medical decision making (see chart for details).     84 year old female with 3 falls in the past 24 hours.  She is generally not a very good historian.  She does have noticeable right lower extremity weakness on exam.  Per her sister, this is new.  Patient is unreliable as to exact onset though.  CT of her head w/o acute findins.  Lightheaded will admit for stroke workup.  Hypokalemic as well.  Replete.  Final Clinical Impressions(s) / ED Diagnoses   Final diagnoses:  Right leg weakness  Hypokalemia  Frequent falls    ED Discharge Orders    None       Raeford Razor, MD 08/09/17 1621    Raeford Razor, MD 08/09/17 [phone]

## 2017-08-09 NOTE — ED Notes (Addendum)
ED TO INPATIENT HANDOFF REPORT  Name/Age/Gender Morgan Ross 84 y.o. female  Code Status Code Status History    This patient does not have a recorded code status. Please follow your organizational policy for patients in this situation.      Home/SNF/Other Home  Chief Complaint Fall  Level of Care/Admitting Diagnosis ED Disposition    ED Disposition Condition Comment   Admit  Hospital Area: [facility] [100102]  Level of Care: Telemetry [5]  Admit to tele based on following criteria: Other see comments  Comments: TIA  Diagnosis: TIA (transient ischemic attack) [332951]  Admitting Physician: Caren Griffins [phone]  Attending Physician: Caren Griffins [[id number]]  PT Class (Do Not Modify): Observation [104]  PT Acc Code (Do Not Modify): Observation [10022]       Medical History Past Medical History:  Diagnosis Date  . Acid reflux   . Atrial fibrillation ([place])   . Depression   . Diabetes mellitus   . High cholesterol   . Hypertension   . Irregular heart rate   . Macular degeneration   . Memory changes   . Stroke ([place])   . TIA (transient ischemic attack)     Allergies No Known Allergies  IV Location/Drains/Wounds Patient Lines/Drains/Airways Status   Active Line/Drains/Airways    Name:   Placement date:   Placement time:   Site:   Days:   Peripheral IV 08/09/17 Left Antecubital   08/09/17    1209    Antecubital   less than 1          Labs/Imaging Results for orders placed or performed during the hospital encounter of 08/09/17 (from the past 48 hour(s))  CBC with Differential     Status: Abnormal   Collection Time: 08/09/17  2:13 PM  Result Value Ref Range   WBC 11.9 (H) 4.0 - 10.5 K/uL   RBC 4.20 3.87 - 5.11 MIL/uL   Hemoglobin 12.4 12.0 - 15.0 g/dL   HCT 37.1 36.0 - 46.0 %   MCV 88.3 78.0 - 100.0 fL   MCH 29.5 26.0 - 34.0 pg   MCHC 33.4 30.0 - 36.0 g/dL   RDW 14.7 11.5 - 15.5 %   Platelets 158 150 - 400 K/uL   Neutrophils Relative % 74 %   Neutro Abs 8.7 (H) 1.7 - 7.7 K/uL   Lymphocytes Relative 14 %   Lymphs Abs 1.7 0.7 - 4.0 K/uL   Monocytes Relative 11 %   Monocytes Absolute 1.4 (H) 0.1 - 1.0 K/uL   Eosinophils Relative 1 %   Eosinophils Absolute 0.2 0.0 - 0.7 K/uL   Basophils Relative 0 %   Basophils Absolute 0.0 0.0 - 0.1 K/uL  Basic metabolic panel     Status: Abnormal   Collection Time: 08/09/17  2:13 PM  Result Value Ref Range   Sodium 144 135 - 145 mmol/L   Potassium 2.9 (L) 3.5 - 5.1 mmol/L   Chloride 110 101 - 111 mmol/L   CO2 26 22 - 32 mmol/L   Glucose, Bld 93 65 - 99 mg/dL   BUN 25 (H) 6 - 20 mg/dL   Creatinine, Ser 1.88 (H) 0.44 - 1.00 mg/dL   Calcium 8.0 (L) 8.9 - 10.3 mg/dL   GFR calc non Af Amer 22 (L) >60 mL/min   GFR calc Af Amer 26 (L) >60 mL/min    Comment: (NOTE) The eGFR has been calculated using the CKD EPI equation. This calculation has not been validated  in all clinical situations. eGFR's persistently <60 mL/min signify possible Chronic Kidney Disease.    Anion gap 8 5 - 15   Ct Head Wo Contrast  Result Date: 08/09/2017 CLINICAL DATA:  Multiple unwitnessed falls over the last 3 days. History of dementia. EXAM: CT HEAD WITHOUT CONTRAST TECHNIQUE: Contiguous axial images were obtained from the base of the skull through the vertex without intravenous contrast. COMPARISON:  None. FINDINGS: Brain: Small region of poor gray-white differentiation in the right frontal lobe for example image 9/2 compatible with age indeterminate but more likely chronic infarct. Left thalamic remote lacunar infarct 0.8 by 0.5 cm on image 15/2. Otherwise, The brainstem, cerebellum, cerebral peduncles, thalami, basal ganglia, basilar cisterns, and ventricular system appear within normal limits. Periventricular white matter and corona radiata hypodensities favor chronic ischemic microvascular white matter disease. No intracranial hemorrhage, mass lesion, or acute CVA. Vascular: There is  atherosclerotic calcification of the cavernous carotid arteries bilaterally. Skull: Unremarkable Sinuses/Orbits: Complete opacification of the visualized part of the left maxillary sinus. Opacification of multiple ethmoid air cells. Complete opacification of the visualized part of the left frontal sinus. Other: No supplemental non-categorized findings. IMPRESSION: 1. No acute intracranial hemorrhage or specific acute intracranial findings. 2. Remote lacunar infarct in the left thalamus. Small region of poor gray-white differentiation in the right frontal lobe is probably a small remote infarct but is essentially age indeterminate. 3. Complete opacification of the visualized part of the left maxillary sinus and also of the left frontal sinus. This could reflect acute or chronic sinusitis. Chronic ethmoid sinusitis is also present. Electronically Signed   By: Van Clines M.D.   On: 08/09/2017 15:17    Pending Labs Unresulted Labs (From admission, onward)   Start     Ordered   08/09/17 1359  Urinalysis, Routine w reflex microscopic  Once,   R     08/09/17 1402   Signed and Held  Hemoglobin A1c  Tomorrow morning,   R     Signed and Held   Signed and Held  Lipid panel  Tomorrow morning,   R    Comments:  Fasting    Signed and Held   Signed and Held  CBC  Tomorrow morning,   R    Question:  Specimen collection method  Answer:  Lab=Lab collect   Signed and Held   Signed and Held  Comprehensive metabolic panel  Tomorrow morning,   R    Question:  Specimen collection method  Answer:  Lab=Lab collect   Signed and Held      Vitals/Pain Today's Vitals   08/09/17 1344 08/09/17 1613 08/09/17 1800 08/09/17 2048  BP: 138/68 (!) 139/46 (!) 149/50 (!) 128/107  Pulse: 66 65 65 94  Resp: _0 Temp:      TempSrc:      SpO2: 99% 96% 98% 100%    Isolation Precautions No active isolations  Medications Medications  sodium chloride 0.9 % bolus 1,000 mL (0 mLs Intravenous Stopped 08/09/17  1808)  potassium chloride SA (K-DUR,KLOR-CON) CR tablet 60 mEq (60 mEq Oral Given 08/09/17 1622)  magnesium sulfate IVPB 1 g 100 mL (0 g Intravenous Stopped 08/09/17 1647)  aspirin chewable tablet 324 mg (324 mg Oral Given 08/09/17 1622)    Mobility Walks with assistive device

## 2017-08-09 NOTE — ED Triage Notes (Signed)
Transported by [facility] from home--patient's sister reported multiple unwitnessed falls over the last 3 days. Patient has a hx of dementia, oriented x1 per baseline. EMS reports no obvious signs of injury, pt initially refused transport to hospital. 5 mg of Versed given IM by EMS because patient became uncooperative and combative. C-Collar intact.

## 2017-08-09 NOTE — ED Notes (Signed)
Bed: [id number] Expected date:  Expected time:  Means of arrival:  Comments: EMS-falls

## 2017-08-10 ENCOUNTER — Observation Stay (HOSPITAL_COMMUNITY): Payer: Medicare Other

## 2017-08-10 ENCOUNTER — Encounter (HOSPITAL_COMMUNITY): Payer: Self-pay

## 2017-08-10 ENCOUNTER — Other Ambulatory Visit: Payer: Self-pay

## 2017-08-10 DIAGNOSIS — E876 Hypokalemia: Secondary | ICD-10-CM | POA: Diagnosis not present

## 2017-08-10 DIAGNOSIS — I10 Essential (primary) hypertension: Secondary | ICD-10-CM | POA: Diagnosis not present

## 2017-08-10 DIAGNOSIS — R296 Repeated falls: Secondary | ICD-10-CM | POA: Diagnosis not present

## 2017-08-10 LAB — COMPREHENSIVE METABOLIC PANEL
ALK PHOS: 99 U/L (ref 38–126)
ALT: 39 U/L (ref 14–54)
ANION GAP: 8 (ref 5–15)
AST: 91 U/L — ABNORMAL HIGH (ref 15–41)
Albumin: 2.7 g/dL — ABNORMAL LOW (ref 3.5–5.0)
BUN: 27 mg/dL — ABNORMAL HIGH (ref 6–20)
CALCIUM: 7.8 mg/dL — AB (ref 8.9–10.3)
CO2: 23 mmol/L (ref 22–32)
CREATININE: 1.36 mg/dL — AB (ref 0.44–1.00)
Chloride: 111 mmol/L (ref 101–111)
GFR, EST AFRICAN AMERICAN: 38 mL/min — AB (ref >60)
GFR, EST NON AFRICAN AMERICAN: 33 mL/min — AB (ref >60)
Glucose, Bld: 88 mg/dL (ref 65–99)
Potassium: 3.8 mmol/L (ref 3.5–5.1)
Sodium: 142 mmol/L (ref 135–145)
Total Bilirubin: 1 mg/dL (ref 0.3–1.2)
Total Protein: 5.3 g/dL — ABNORMAL LOW (ref 6.5–8.1)

## 2017-08-10 LAB — CBC
HEMATOCRIT: 36.2 % (ref 36.0–46.0)
HEMOGLOBIN: 11.9 g/dL — AB (ref 12.0–15.0)
MCH: 29.5 pg (ref 26.0–34.0)
MCHC: 32.9 g/dL (ref 30.0–36.0)
MCV: 89.6 fL (ref 78.0–100.0)
Platelets: 143 10*3/uL — ABNORMAL LOW (ref 150–400)
RBC: 4.04 MIL/uL (ref 3.87–5.11)
RDW: 14.9 % (ref 11.5–15.5)
WBC: 10.2 10*3/uL (ref 4.0–10.5)

## 2017-08-10 LAB — LIPID PANEL
CHOLESTEROL: 121 mg/dL (ref 0–200)
HDL: 32 mg/dL — ABNORMAL LOW (ref >40)
LDL Cholesterol: 70 mg/dL (ref 0–99)
TRIGLYCERIDES: 96 mg/dL (ref <150)
Total CHOL/HDL Ratio: 3.8 RATIO
VLDL: 19 mg/dL (ref 0–40)

## 2017-08-10 LAB — HEMOGLOBIN A1C
HEMOGLOBIN A1C: 4.6 % — AB (ref 4.8–5.6)
MEAN PLASMA GLUCOSE: 85.32 mg/dL

## 2017-08-10 MED ORDER — INFLUENZA VAC SPLIT HIGH-DOSE 0.5 ML IM SUSY
0.5000 mL | PREFILLED_SYRINGE | INTRAMUSCULAR | Status: AC
  Administered 2017-08-11: 0.5 mL via INTRAMUSCULAR
  Filled 2017-08-10: qty 0.5

## 2017-08-10 NOTE — Care Management Note (Signed)
Case Management Note  Patient Details  Name: Geralyn CorwinVirginia S Alcalde MRN: [id number] Date of Birth: 05/10/1923  Subjective/Objective:                  Fall/ pt lives alone independently.  Action/Plan: Date:  August 10, 2017 Chart reviewed for concurrent status and case management needs.  Will continue to follow patient progress.  Discharge Planning: following for needs.  None present at this time of review. Expected discharge date: August 13 2017 Marcelle SmilingRhonda Deonte Otting, BSN, RiddleRN3, [place]M   [phone]   Expected Discharge Date:                  Expected Discharge Plan:  Home/Self Care  In-House Referral:     Discharge planning Services  CM Consult  Post Acute Care Choice:    Choice offered to:     DME Arranged:    DME Agency:     HH Arranged:    HH Agency:     Status of Service:  In process, will continue to follow  If discussed at Long Length of Stay Meetings, dates discussed:    Additional Comments:  Golda AcreDavis, Madelyne Millikan Lynn, RN 08/10/2017, 9:28 AM

## 2017-08-10 NOTE — Progress Notes (Signed)
PROGRESS NOTE    [place]  WUJ:[id number] DOB: Oct 29, 1922 DOA: 08/09/2017 PCP: Clovis Riley, L.August Saucer, MD   Brief Narrative:84 y.o. female with medical history significant of ?A. Fib, hypertension, hyperlipidemia, advanced dementia, presents to the hospital with chief complaint of recurrent falls at home.  Patient has underlying dementia and is unable to contribute to the story, sister who is the main caregiver is at bedside and she tells me that patient has been having recurrent falls over the last couple of days.  Patient's sister also noticed that her right leg has been weaker in the last couple of days, however she is not exactly sure when this started.  Patient is pleasantly demented, and has no complaints, denies any pain.  She does state that her right leg feels heavier than normal.  There are no reported fever or chills, no abdominal pain nausea vomiting or diarrhea.  No reported chest pains or palpitations.  No reported syncopal episodes it appears that she has mechanical falls due to right lower extremity weakness  ED Course: In the emergency room her patient's vital signs are stable, her blood work shows a creatinine of 1.88 from prior normal values, potassium of 2.9, CT scan of the brain without abnormalities.  EKG shows sinus rhythm.  EDP discussed with neurology who recommended to be admitted for MRI to rule out stroke, however there may be other issues that contribute to her weakness, and will admit patient to [facility] long hospital 1/16-she is confused   Assessment & Plan:   Active Problems:   TIA (transient ischemic attack)   HTN (hypertension)   AKI (acute kidney injury) (HCC)   Right leg weakness   Dementia  Right lower extremity weakness/recurrent falls /concern for stroke -Unclear timeline when this started, patient's sister just noticed this in the last couple of days however patient does have a history of prior falls, most recent one about a month ago. Mri  showsAtrophy and chronic ischemia.  No acute abnormality.  -Continue home aspirin -I see A. fib as part of her medical history however I have no records of that.  She is in sinus here.  Monitor on telemetry.  I do not think she is a good anticoagulation candidate given advanced age/dementia as well as recurrent falls -Urinalysis pending, if positive for UTI will treat  Acute kidney injury -Unclear baseline creatinine, it was normal back in 2011 at 0.95, gentle hydration overnight and repeat renal function in the morning -Obtain renal ultrasound to assess chronic component -Hold Cozaar -renal function improved to 1.36 with hydration.  Hypertension -Hold Cozaar, monitor blood pressure and if persistently high will need alternative agent  Hypokalemia -Potassium repleted by EDP, will recheck blood work tomorrow morning  Dementia -Continue home medications     DVT prophylaxis:heparin Code Status:dnr Family Communication: will call sisiter Disposition Plan: tbd Consultants:  none Procedures: none Antimicrobials:  none Subjective:asking for water to drink..   Objective: Vitals:   08/09/17 2208 08/10/17 0621 08/10/17 1030 08/10/17 1300  BP:  126/64 (!) 152/64 (!) 152/62  Pulse:  76 73 77  Resp:  18 16   Temp:  98.1 F (36.7 C) 97.8 F (36.6 C) (!) 97.4 F (36.3 C)  TempSrc:  Oral Oral Oral  SpO2:  97% 99% 99%  Weight: 70 kg (154 lb 6.4 oz)     Height: 5\' 2"  (1.575 m)       Intake/Output Summary (Last 24 hours) at 08/10/2017 1322 Last data filed at 08/10/2017 1044 Gross  per 24 hour  Intake 1968.75 ml  Output -  Net 1968.75 ml   Filed Weights   08/09/17 2208  Weight: 70 kg (154 lb 6.4 oz)    Examination:  General exam: Appears calm and comfortable  Respiratory system: Clear to auscultation. Respiratory effort normal. Cardiovascular system: S1 & S2 heard, RRR. No JVD, murmurs, rubs, gallops or clicks. No pedal edema. Gastrointestinal system: Abdomen is  nondistended, soft and nontender. No organomegaly or masses felt. Normal bowel sounds heard. Central nervous system: Alert and oriented. No focal neurological deficits. Extremities: Symmetric 5 x 5 power. Skin: No rashes, lesions or ulcers Psychiatry: Judgement and insight appear normal. Mood & affect appropriate.     Data Reviewed: I have personally reviewed following labs and imaging studies  CBC: Recent Labs  Lab 08/09/17 1413 08/10/17 0612  WBC 11.9* 10.2  NEUTROABS 8.7*  --   HGB 12.4 11.9*  HCT 37.1 36.2  MCV 88.3 89.6  PLT 158 143*   Basic Metabolic Panel: Recent Labs  Lab 08/09/17 1413 08/10/17 0612  NA 144 142  K 2.9* 3.8  CL 110 111  CO2 26 23  GLUCOSE 93 88  BUN 25* 27*  CREATININE 1.88* 1.36*  CALCIUM 8.0* 7.8*   GFR: Estimated Creatinine Clearance: 24.2 mL/min (A) (by C-G formula based on SCr of 1.36 mg/dL (H)). Liver Function Tests: Recent Labs  Lab 08/10/17 0612  AST 91*  ALT 39  ALKPHOS 99  BILITOT 1.0  PROT 5.3*  ALBUMIN 2.7*   No results for input(s): LIPASE, AMYLASE in the last 168 hours. No results for input(s): AMMONIA in the last 168 hours. Coagulation Profile: No results for input(s): INR, PROTIME in the last 168 hours. Cardiac Enzymes: No results for input(s): CKTOTAL, CKMB, CKMBINDEX, TROPONINI in the last 168 hours. BNP (last 3 results) No results for input(s): PROBNP in the last 8760 hours. HbA1C: Recent Labs    08/10/17 0612  HGBA1C 4.6*   CBG: No results for input(s): GLUCAP in the last 168 hours. Lipid Profile: Recent Labs    08/10/17 0612  CHOL 121  HDL 32*  LDLCALC 70  TRIG 96  CHOLHDL 3.8   Thyroid Function Tests: No results for input(s): TSH, T4TOTAL, FREET4, T3FREE, THYROIDAB in the last 72 hours. Anemia Panel: No results for input(s): VITAMINB12, FOLATE, FERRITIN, TIBC, IRON, RETICCTPCT in the last 72 hours. Sepsis Labs: No results for input(s): PROCALCITON, LATICACIDVEN in the last 168 hours.  No  results found for this or any previous visit (from the past 240 hour(s)).       Radiology Studies: Ct Head Wo Contrast  Result Date: 08/09/2017 CLINICAL DATA:  Multiple unwitnessed falls over the last 3 days. History of dementia. EXAM: CT HEAD WITHOUT CONTRAST TECHNIQUE: Contiguous axial images were obtained from the base of the skull through the vertex without intravenous contrast. COMPARISON:  None. FINDINGS: Brain: Small region of poor gray-white differentiation in the right frontal lobe for example image 9/2 compatible with age indeterminate but more likely chronic infarct. Left thalamic remote lacunar infarct 0.8 by 0.5 cm on image 15/2. Otherwise, The brainstem, cerebellum, cerebral peduncles, thalami, basal ganglia, basilar cisterns, and ventricular system appear within normal limits. Periventricular white matter and corona radiata hypodensities favor chronic ischemic microvascular white matter disease. No intracranial hemorrhage, mass lesion, or acute CVA. Vascular: There is atherosclerotic calcification of the cavernous carotid arteries bilaterally. Skull: Unremarkable Sinuses/Orbits: Complete opacification of the visualized part of the left maxillary sinus. Opacification of multiple ethmoid  air cells. Complete opacification of the visualized part of the left frontal sinus. Other: No supplemental non-categorized findings. IMPRESSION: 1. No acute intracranial hemorrhage or specific acute intracranial findings. 2. Remote lacunar infarct in the left thalamus. Small region of poor gray-white differentiation in the right frontal lobe is probably a small remote infarct but is essentially age indeterminate. 3. Complete opacification of the visualized part of the left maxillary sinus and also of the left frontal sinus. This could reflect acute or chronic sinusitis. Chronic ethmoid sinusitis is also present. Electronically Signed   By: Gaylyn Rong M.D.   On: 08/09/2017 15:17   Mr Brain Wo  Contrast  Result Date: 08/10/2017 CLINICAL DATA:  Confusion dementia.  Possible stroke EXAM: MRI HEAD WITHOUT CONTRAST TECHNIQUE: Multiplanar, multiecho pulse sequences of the brain and surrounding structures were obtained without intravenous contrast. COMPARISON:  CT head 08/09/2017 FINDINGS: Brain: Moderate to advanced cerebral atrophy. Negative for acute infarct. Chronic microvascular ischemic change in the white matter, basal ganglia, thalamus, and pons bilaterally. Small chronic infarct right frontal lobe over the convexity. Negative for hemorrhage or mass. No midline shift. Vascular: Normal arterial flow void Skull and upper cervical spine: Negative Sinuses/Orbits: Mucosal edema and retained secretions in the left frontal ethmoid and maxillary sinus unchanged from CT yesterday. Bilateral cataract removal. Other: None IMPRESSION: Atrophy and chronic ischemia.  No acute abnormality. Electronically Signed   By: Marlan Palau M.D.   On: 08/10/2017 12:33        Scheduled Meds: .  stroke: mapping our early stages of recovery book   Does not apply Once  . aspirin EC  81 mg Oral Daily  . memantine  28 mg Oral QHS   And  . donepezil  10 mg Oral QHS  . heparin  5,000 Units Subcutaneous Q8H  . [START ON 08/11/2017] Influenza vac split quadrivalent PF  0.5 mL Intramuscular Tomorrow-1000   Continuous Infusions: . sodium chloride 75 mL/hr at 08/10/17 1212     LOS: 0 days      Alwyn Ren, MD [facility]  If 7PM-7AM, please contact night-coverage [email or web address] Password [email or web address] 08/10/2017, 1:22 PM

## 2017-08-10 NOTE — Evaluation (Addendum)
Occupational Therapy Evaluation Patient Details Name: Morgan Ross MRN: [id number] DOB: Apr 04, 1923 Today's Date: 08/10/2017    History of Present Illness 84 y.o. female with medical history significant of macular degeneration, A. Fib, hypertension, hyperlipidemia, advanced dementia, presents to the hospital with chief complaint of recurrent falls at home.     Clinical Impression   Pt admitted with above. She demonstrates the below listed deficits and will benefit from continued OT to maximize safety and independence with BADLs.  Pt presents to OT with generalized weakness, edema bil. LEs Rt > Lt, - Homan's (RN made aware), impaired balance, impaired cognition.  She currently requires mod - max A for LB ADLs due to difficulty accessing feet due to LE edema.  She lives with her elderly sister, who is struggling to provide necessary level of care.  PTA, sister reports pt was Mod I, but is unsure if pt bathes regularly, or how she does so.   Recommend SNF level rehab as sister is unable to provide level of care pt requires and she is at high risk for falls, injury, and readmission.  Anticipate, she may need to transition to ALF/long term care after SNF.  Will follow acutely.        Follow Up Recommendations  SNF;Supervision/Assistance - 24 hour    Equipment Recommendations  3 in 1 bedside commode    Recommendations for Other Services       Precautions / Restrictions Precautions Precautions: Fall Restrictions Weight Bearing Restrictions: No      Mobility Bed Mobility Overal bed mobility: Needs Assistance Bed Mobility: Sit to Supine     Supine to sit: Supervision;HOB elevated Sit to supine: Min assist   General bed mobility comments: assist to lift LEs onto bed   Transfers Overall transfer level: Needs assistance Equipment used: Rolling walker (2 wheeled) Transfers: Sit to/from [employer] Sit to Stand: Min assist Stand pivot transfers: Min assist        General transfer comment: assist for balance     Balance Overall balance assessment: Needs assistance Sitting-balance support: Feet supported Sitting balance-Leahy Scale: Fair   Postural control: Posterior lean Standing balance support: Bilateral upper extremity supported Standing balance-Leahy Scale: Poor Standing balance comment: reliant on UE support and min A                            ADL either performed or assessed with clinical judgement   ADL Overall ADL's : Needs assistance/impaired Eating/Feeding: Set up;Sitting   Grooming: Wash/dry hands;Wash/dry face;Oral care;Brushing hair;Minimal assistance;Standing   Upper Body Bathing: Minimal assistance;Sitting   Lower Body Bathing: Minimal assistance;Sit to/from stand   Upper Body Dressing : Minimal assistance;Sitting   Lower Body Dressing: Maximal assistance;Sit to/from stand Lower Body Dressing Details (indicate cue type and reason): Pt with difficulty accessing LEs due to Rt LE edema  Toilet Transfer: Minimal assistance;Ambulation;Comfort height toilet;BSC;RW   Toileting- Clothing Manipulation and Hygiene: Moderate assistance;Sit to/from stand       Functional mobility during ADLs: Minimal assistance;Rolling walker       Vision         Perception     Praxis      Pertinent Vitals/Pain Pain Assessment: No/denies pain     Hand Dominance Right   Extremity/Trunk Assessment Upper Extremity Assessment Upper Extremity Assessment: Generalized weakness   Lower Extremity Assessment Lower Extremity Assessment: Defer to PT evaluation   Cervical / Trunk Assessment Cervical / Trunk Assessment: Kyphotic  Communication Communication Communication: No difficulties   Cognition Arousal/Alertness: Awake/alert Behavior During Therapy: Flat affect Overall Cognitive Status: History of cognitive impairments - at baseline Area of Impairment: Orientation;Memory;Safety/judgement                  Orientation Level: Place;Time;Disoriented to(knows she is in hospital in [place] due to falls)   Memory: Decreased short-term memory   Safety/Judgement: Decreased awareness of deficits     General Comments: h/o dementia    General Comments  Pt with bil. LE edema Rt > Lt.  Sister is unaware if this is new or her baseline.   Notified RN, who reports he is aware, and has documented it in the computer, and assured this therapist that MD was aware.  MD text paged.     Exercises     Shoulder Instructions      Home Living Family/patient expects to be discharged to:: Skilled nursing facility Living Arrangements: Other relatives Available Help at Discharge: Family Type of Home: House Home Access: Stairs to enter [employer] of Steps: 2 Entrance Stairs-Rails: None Home Layout: One level               Home Equipment: Environmental consultant - 2 wheels;Shower seat          Prior Functioning/Environment Level of Independence: Independent with assistive device(s)        Comments: sister reports that pt requires set up for dressing.  she reports she thinks pt sponge bathes, but is unsure that she actually does so.  She wears disposable briefs at home, sister is unaware if she has incontinence         OT Problem List: Decreased strength;Decreased activity tolerance;Impaired balance (sitting and/or standing);Decreased safety awareness;Decreased cognition;Decreased knowledge of use of DME or AE;Increased edema      OT Treatment/Interventions: Self-care/ADL training;DME and/or AE instruction;Therapeutic activities;Cognitive remediation/compensation;Patient/family education;Balance training    OT Goals(Current goals can be found in the care plan section) Acute Rehab OT Goals Patient Stated Goal: sister would like her to get stronger and improve balance  OT Goal Formulation: With family Time For Goal Achievement: 08/24/17 Potential to Achieve Goals: Good ADL Goals Pt Will Perform  Grooming: with supervision;standing Pt Will Perform Upper Body Bathing: with supervision;sitting Pt Will Perform Lower Body Bathing: with supervision Pt Will Perform Lower Body Dressing: with supervision;sit to/from stand Pt Will Transfer to Toilet: with supervision;anterior/posterior transfer;regular height toilet;bedside commode;grab bars Pt Will Perform Toileting - Clothing Manipulation and hygiene: with supervision;sit to/from stand  OT Frequency: Min 2X/week   Barriers to D/C: Decreased caregiver support  sister is struggling to meet pt's needs        Co-evaluation              AM-PAC PT "6 Clicks" Daily Activity     Outcome Measure Help from another person eating meals?: A Little Help from another person taking care of personal grooming?: A Little Help from another person toileting, which includes using toliet, bedpan, or urinal?: A Little Help from another person bathing (including washing, rinsing, drying)?: A Lot Help from another person to put on and taking off regular upper body clothing?: A Little Help from another person to put on and taking off regular lower body clothing?: A Lot 6 Click Score: 16   End of Session Equipment Utilized During Treatment: Rolling walker Nurse Communication: Mobility status;Other (comment)(LE edema )  Activity Tolerance: Patient limited by fatigue Patient left: in bed;with call bell/phone within reach;with bed alarm set  OT Visit Diagnosis: Unsteadiness on feet (R26.81)                Time: 8119-14781455-1516 OT Time Calculation (min): 21 min Charges:  OT General Charges $OT Visit: 1 Visit OT Evaluation $OT Eval Moderate Complexity: 1 Mod G-Codes:     Reynolds AmericanWendi Elisia Stepp, OTR/L [phone]   Jeani HawkingConarpe, Akaiya Touchette M 08/10/2017, 3:37 PM

## 2017-08-10 NOTE — Care Management Obs Status (Signed)
MEDICARE OBSERVATION STATUS NOTIFICATION   Patient Details  Name: Morgan Ross MRN: [id number] Date of Birth: 05-Nov-1922   Medicare Observation Status Notification Given:  Yes    Golda AcreDavis, Dwight Adamczak Lynn, RN 08/10/2017, 1:23 PM

## 2017-08-10 NOTE — Evaluation (Signed)
Physical Therapy Evaluation Patient Details Name: Geralyn CorwinVirginia S Baldyga MRN: [id number] DOB: [birth year] Today's Date: 08/10/2017   History of Present Illness  84 y.o. female with medical history significant of macular degeneration, A. Fib, hypertension, hyperlipidemia, advanced dementia, presents to the hospital with chief complaint of recurrent falls at home.    Clinical Impression  Patient presents with decreased independence/safety with mobility due to weakness and multiple recent falls.  Feel she can benefit from SNF level rehab at d/c as sister unable to physically assist and pt weak and at high fall risk.  PT to follow acutely.    Follow Up Recommendations SNF;Supervision/Assistance - 24 hour    Equipment Recommendations  None recommended by PT    Recommendations for Other Services       Precautions / Restrictions Precautions Precautions: Fall Restrictions Weight Bearing Restrictions: No      Mobility  Bed Mobility Overal bed mobility: Needs Assistance Bed Mobility: Supine to Sit     Supine to sit: Supervision;HOB elevated     General bed mobility comments: assist for safety with IV and due to weakness  Transfers Overall transfer level: Needs assistance Equipment used: Rolling walker (2 wheeled) Transfers: Sit to/from Stand Sit to Stand: Min assist         General transfer comment: assist for balance/safety  Ambulation/Gait Ambulation/Gait assistance: Min assist;+2 safety/equipment(chair following) Ambulation Distance (Feet): 70 Feet Assistive device: Rolling walker (2 wheeled) Gait Pattern/deviations: Step-through pattern;Decreased stride length;Drifts right/left;Shuffle     General Gait Details: assist for balance due to unsteady with general weakness  Stairs            Wheelchair Mobility    Modified Rankin (Stroke Patients Only) Modified Rankin (Stroke Patients Only) Pre-Morbid Rankin Score: Moderate disability Modified Rankin: Moderately  severe disability     Balance Overall balance assessment: Needs assistance Sitting-balance support: Feet supported Sitting balance-Leahy Scale: Fair     Standing balance support: Bilateral upper extremity supported Standing balance-Leahy Scale: Poor Standing balance comment: UE support needed in standing, did attempt to pull up briefs after toileting with min A needed for balance and cues for at least 1 hand support                             Pertinent Vitals/Pain Pain Assessment: No/denies pain    Home Living Family/patient expects to be discharged to:: Skilled nursing facility Living Arrangements: Other relatives(sister) Available Help at Discharge: Family Type of Home: House Home Access: Stairs to enter Entrance Stairs-Rails: None Entrance Stairs-Number of Steps: 2 Home Layout: One level Home Equipment: Walker - 2 wheels;Shower seat      Prior Function Level of Independence: Independent with assistive device(s)         Comments: sister with recent hospitalization also, reports she doesn't know if pt sponge bathes (assumes she does)     Hand Dominance        Extremity/Trunk Assessment   Upper Extremity Assessment Upper Extremity Assessment: Generalized weakness    Lower Extremity Assessment Lower Extremity Assessment: Generalized weakness    Cervical / Trunk Assessment Cervical / Trunk Assessment: Kyphotic  Communication   Communication: No difficulties  Cognition Arousal/Alertness: Awake/alert Behavior During Therapy: Flat affect Overall Cognitive Status: Impaired/Different from baseline Area of Impairment: Orientation;Memory;Safety/judgement                 Orientation Level: Place;Time;Disoriented to([place] to [place])   Memory:  Decreased short-term memory   Safety/Judgement: Decreased awareness of deficits            General Comments General comments (skin integrity, edema, etc.):  Sister in room    Exercises     Assessment/Plan    PT Assessment Patient needs continued PT services  PT Problem List Decreased strength;Decreased mobility;Decreased safety awareness;Decreased activity tolerance;Decreased balance;Decreased knowledge of use of DME       PT Treatment Interventions DME instruction;Functional mobility training;Balance training;Gait training;Therapeutic activities;Stair training;Therapeutic exercise    PT Goals (Current goals can be found in the Care Plan section)  Acute Rehab PT Goals Patient Stated Goal: To get stronger, stop falling PT Goal Formulation: With patient/family Time For Goal Achievement: 08/17/17 Potential to Achieve Goals: Good    Frequency Min 3X/week   Barriers to discharge        Co-evaluation               AM-PAC PT "6 Clicks" Daily Activity  Outcome Measure Difficulty turning over in bed (including adjusting bedclothes, sheets and blankets)?: A Little Difficulty moving from lying on back to sitting on the side of the bed? : A Little Difficulty sitting down on and standing up from a chair with arms (e.g., wheelchair, bedside commode, etc,.)?: Unable Help needed moving to and from a bed to chair (including a wheelchair)?: A Little Help needed walking in hospital room?: A Little Help needed climbing 3-5 steps with a railing? : A Little 6 Click Score: 16    End of Session Equipment Utilized During Treatment: Gait belt Activity Tolerance: Patient tolerated treatment well;Patient limited by fatigue Patient left: in chair;with chair alarm set;with family/visitor present   PT Visit Diagnosis: Other abnormalities of gait and mobility (R26.89);Muscle weakness (generalized) (M62.81)    Time: 1610-9604 PT Time Calculation (min) (ACUTE ONLY): 26 min   Charges:   PT Evaluation $PT Eval Moderate Complexity: 1 Mod PT Treatments $Gait Training: 8-22 mins   PT G CodesSheran Lawless,  [place] [phone] 08/10/2017   Elray Mcgregor 08/10/2017, 2:50 PM

## 2017-08-11 DIAGNOSIS — E876 Hypokalemia: Secondary | ICD-10-CM | POA: Diagnosis not present

## 2017-08-11 LAB — BASIC METABOLIC PANEL
ANION GAP: 5 (ref 5–15)
BUN: 25 mg/dL — ABNORMAL HIGH (ref 6–20)
CHLORIDE: 115 mmol/L — AB (ref 101–111)
CO2: 23 mmol/L (ref 22–32)
CREATININE: 0.91 mg/dL (ref 0.44–1.00)
Calcium: 7.7 mg/dL — ABNORMAL LOW (ref 8.9–10.3)
GFR calc non Af Amer: 53 mL/min — ABNORMAL LOW (ref >60)
Glucose, Bld: 86 mg/dL (ref 65–99)
POTASSIUM: 3.4 mmol/L — AB (ref 3.5–5.1)
Sodium: 143 mmol/L (ref 135–145)

## 2017-08-11 NOTE — Clinical Social Work Note (Signed)
Clinical Social Work Assessment  Patient Details  Name: Morgan CorwinVirginia S Ross MRN: [id number] Date of Birth: 06/25/1923  Date of referral:  08/11/17               Reason for consult:  Facility Placement, Discharge Planning                Permission sought to share information with:  Case Manager, Magazine features editoracility Contact Representative, Family Supports Permission granted to share information::  Yes, Verbal Permission Granted  Name::     Insurance underwriterCarol  Agency::  SNF  Relationship::  Sister  Contact Information:     Housing/Transportation Living arrangements for the past 2 months:  Single Family Home Source of Information:  Siblings Patient Interpreter Needed:  None Criminal Activity/Legal Involvement Pertinent to Current Situation/Hospitalization:  No - Comment as needed Significant Relationships:  Siblings Lives with:  Siblings Do you feel safe going back to the place where you live?  Yes Need for family participation in patient care:  Yes (Comment)  Care giving concerns:  Patient lives with her sister who is her primary care provider. Her sister was previously ill and still recovering. Patient has had multiple falls in the past week, which led her to her current hospital visit.    Social Worker assessment / plan: LCSW following for SNF placement.   Patient came to the hospital due to multiple falls.   Patient was not able to participate in assessment due to dementia. Patient is not oriented.   LCSW spoke with patient's sister by phone. Patient's sister was unable to visit patient today due to not feeling will.   Sister, Morgan RegalCarol, reports that patient ambulates with a walker at home, but declined over the past week and had multiple falls. At baseline sister reports patient dresses her self without assistance and bathes herself, but she is not sure how well patient bathes.   Patient lives in a one story home with her sister. Morgan RegalCarol is patients primary caregiver. Morgan RegalCarol is agreeable to patient going to  SNF at dc. She reports it will be difficult for her to care for patient until patient is able to walk better.   PLAN: SNF at DC.   Employment status:  Retired Database administratornsurance information:  Managed Medicare PT Recommendations:  Skilled Holiday representativeursing Facility, 24 Hour Supervision Information / Referral to community resources:  Skilled Nursing Facility  Patient/Family's Response to care:  Patients sister expressed concern about her sisters multiple falls. Sister asked questions regarding SNF. Sister is concerned that patient doesn't want to go to SNF, but knows that she needs to get stronger.   Patient/Family's Understanding of and Emotional Response to Diagnosis, Current Treatment, and Prognosis:  Patient's family is understanding of diagnosis and agreeable to treatment plan.   Emotional Assessment Appearance:  Appears stated age Attitude/Demeanor/Rapport:  Unable to Assess Affect (typically observed):  Unable to Assess Orientation:  Oriented to Self Alcohol / Substance use:  Not Applicable Psych involvement (Current and /or in the community):  No (Comment)  Discharge Needs  Concerns to be addressed:  No discharge needs identified Readmission within the last 30 days:  No Current discharge risk:  None Barriers to Discharge:  No SNF bed, Insurance Authorization   Coralyn HellingBernette Powell Halbert, LCSW 08/11/2017, 2:01 PM

## 2017-08-11 NOTE — Evaluation (Signed)
Speech Language Pathology Evaluation Patient Details Name: Morgan CorwinVirginia S Ross MRN: [id number] DOB: 1923-03-18 Today's Date: 08/11/2017 Time: [id number] SLP Time Calculation (min) (ACUTE ONLY): 13 min  Problem List:  Patient Active Problem List   Diagnosis Date Noted  . TIA (transient ischemic attack) 08/09/2017  . HTN (hypertension) 08/09/2017  . AKI (acute kidney injury) (HCC) 08/09/2017  . Right leg weakness 08/09/2017  . Dementia 08/09/2017   Past Medical History:  Past Medical History:  Diagnosis Date  . Acid reflux   . Atrial fibrillation (HCC)   . Depression   . Diabetes mellitus   . High cholesterol   . Hypertension   . Irregular heart rate   . Macular degeneration   . Memory changes   . Stroke (HCC)   . TIA (transient ischemic attack)    Past Surgical History: History reviewed. No pertinent surgical history. HPI:  84 yo [facility] with recurrent falls, PMH + for dementia, macular degeneration, Afib, HTN, HLD.  Pt resides with her sister and per therapy recommendation SNF probable dc plan.   Assessment / Plan / Recommendation Clinical Impression  Patient presents with severe cognitive linguistic deficits suspect baseline from dementia.  No focal CN deficits present.  Pt was oriented to self only and using inference ability to hospital.  Strengths noted in language, reading and verbal expression.  Impaired working and episodic memory-noted.  Pt is able to follow two step directions fluently and follows two step basic directions without deficits.  She is also able to read at least basic sentences.  Given MRI negative, suspect pt's cognitive linguistic skills are near baseline as pt has baseline dementia.  No family present to establish baseline.  SlP set up compensation strategy for pt including written sign saying to call for assist - with return demonstration x2.  Pt will require multitude of repetitions with hope to generalize.  Recommend consider follow up with SlP  at SNF to aid transition/safety to new environment.     SLP Assessment  SLP Recommendation/Assessment: All further Speech Lanaguage Pathology  needs can be addressed in the next venue of care(if indicated to aid in transition/generalization of SNF daily activities) SLP Visit Diagnosis: Cognitive communication deficit (R41.841)    Follow Up Recommendations  Skilled Nursing facility    Frequency and Duration           SLP Evaluation Cognition  Overall Cognitive Status: No family/caregiver present to determine baseline cognitive functioning Arousal/Alertness: Awake/alert Orientation Level: Oriented to person;Disoriented to place;Disoriented to time;Disoriented to situation Attention: Sustained Sustained Attention: Impaired Sustained Attention Impairment: Verbal complex Memory: Impaired Memory Impairment: Storage deficit;Retrieval deficit;Decreased long term memory;Decreased short term memory Awareness: Impaired Awareness Impairment: Intellectual impairment Problem Solving: Impaired Problem Solving Impairment: Verbal basic Safety/Judgment: Impaired       Comprehension  Auditory Comprehension Overall Auditory Comprehension: Appears within functional limits for tasks assessed Yes/No Questions: Not tested Commands: Impaired One Step Basic Commands: 75-100% accurate Two Step Basic Commands: 75-100% accurate Multistep Basic Commands: 50-74% accurate Conversation: Simple Visual Recognition/Discrimination Discrimination: Not tested Reading Comprehension Reading Status: (able to read basic short sentences, pt has macular degeneration)    Expression Expression Primary Mode of Expression: Verbal Verbal Expression Overall Verbal Expression: Appears within functional limits for tasks assessed Initiation: No impairment Level of Generative/Spontaneous Verbalization: Sentence Repetition: No impairment Naming: Not tested Pragmatics: No impairment Written Expression Dominant Hand:  Right Written Expression: Not tested   Oral / Motor  Oral Motor/Sensory Function Overall Oral Motor/Sensory Function:  Within functional limits Motor Speech Overall Motor Speech: Appears within functional limits for tasks assessed Respiration: Within functional limits Resonance: Within functional limits Articulation: Within functional limitis Intelligibility: Intelligible Motor Planning: Witnin functional limits   GO                    Chales Abrahams 08/11/2017, 10:00 AM  Donavan Burnet, MS [facility] SLP [phone]

## 2017-08-11 NOTE — NC FL2 (Signed)
[place] MEDICAID FL2 LEVEL OF CARE SCREENING TOOL     IDENTIFICATION  Patient Name: Morgan CorwinVirginia S Hunkele Birthdate: 02-14-1923 Sex: female Admission Date (Current Location): 08/09/2017  [place]   Facility and Address:  [facility],  [address], [place] [id number]      Provider Number: [id number]  Attending Physician Name and Address:  Alwyn RenMathews, Elizabeth G, MD  Relative Name and Phone Number:       Current Level of Care: Hospital Recommended Level of Care: Skilled Nursing Facility Prior Approval Number:    Date Approved/Denied: 08/11/17 PASRR Number: [id number]  Discharge Plan: SNF    Current Diagnoses: Patient Active Problem List   Diagnosis Date Noted  . TIA (transient ischemic attack) 08/09/2017  . HTN (hypertension) 08/09/2017  . AKI (acute kidney injury) (HCC) 08/09/2017  . Right leg weakness 08/09/2017  . Dementia 08/09/2017    Orientation RESPIRATION BLADDER Height & Weight     Self  Normal Incontinent Weight: 154 lb 6.4 oz (70 kg) Height:  5\' 2"  (157.5 cm)  BEHAVIORAL SYMPTOMS/MOOD NEUROLOGICAL BOWEL NUTRITION STATUS      Continent Diet(See dc summary)  AMBULATORY STATUS COMMUNICATION OF NEEDS Skin   Extensive Assist Verbally Normal                       Personal Care Assistance Level of Assistance  Bathing, Feeding, Dressing Bathing Assistance: Limited assistance Feeding assistance: Independent Dressing Assistance: Limited assistance     Functional Limitations Info  Sight, Hearing, Speech Sight Info: Adequate Hearing Info: Adequate Speech Info: Impaired    SPECIAL CARE FACTORS FREQUENCY  PT (By licensed PT), OT (By licensed OT)     PT Frequency: 5x/week OT Frequency: 5x/week            Contractures Contractures Info: Not present    Additional Factors Info  Allergies, Code Status Code Status Info: DNR Allergies Info: NKA           Current Medications (08/11/2017):  This  is the current hospital active medication list Current Facility-Administered Medications  Medication Dose Route Frequency Provider Last Rate Last Dose  .  stroke: mapping our early stages of recovery book   Does not apply Once Gherghe, Costin M, MD      . 0.9 %  sodium chloride infusion   Intravenous Continuous Leatha GildingGherghe, Costin M, MD 75 mL/hr at 08/11/17 1323    . acetaminophen (TYLENOL) tablet 650 mg  650 mg Oral Q4H PRN Leatha GildingGherghe, Costin M, MD       Or  . acetaminophen (TYLENOL) solution 650 mg  650 mg Per Tube Q4H PRN Leatha GildingGherghe, Costin M, MD       Or  . acetaminophen (TYLENOL) suppository 650 mg  650 mg Rectal Q4H PRN Leatha GildingGherghe, Costin M, MD      . aspirin EC tablet 81 mg  81 mg Oral Daily Leatha GildingGherghe, Costin M, MD   81 mg at 08/11/17 0911  . memantine (NAMENDA XR) 24 hr capsule 28 mg  28 mg Oral QHS Leatha GildingGherghe, Costin M, MD   28 mg at 08/10/17 2127   And  . donepezil (ARICEPT) tablet 10 mg  10 mg Oral QHS Leatha GildingGherghe, Costin M, MD   10 mg at 08/10/17 2127  . heparin injection 5,000 Units  5,000 Units Subcutaneous Q8H Leatha GildingGherghe, Costin M, MD   5,000 Units at 08/11/17 1323     Discharge Medications: Please see discharge summary for a list  of discharge medications.  Relevant Imaging Results:  Relevant Lab Results:   Additional Information ssn: [id number]  Coralyn Helling, LCSW

## 2017-08-11 NOTE — Progress Notes (Signed)
PROGRESS NOTE    [place]  OZH:[id number] DOB: 01-17-1923 DOA: 08/09/2017 PCP: Clovis Riley, L.August Saucer, MD  Brief Narrative: 84 y.o.femalewith medical history significant of?A. Fib,hypertension, hyperlipidemia, advanced dementia, presents to the hospital with chief complaint of recurrent falls at home. Patient has underlying dementia and is unable to contribute to the story, sister who is the main caregiver is at bedside and she tells me that patient has been having recurrent falls over the last couple of days. Patient's sister also noticed that her right leg has been weaker in the last couple of days, however she is not exactly sure when this started. Patient is pleasantly demented, and has no complaints, denies any pain. She does state that her right leg feels heavier than normal. There are no reported fever or chills, no abdominal pain nausea vomiting or diarrhea. No reported chest pains or palpitations. No reported syncopal episodes it appears that she has mechanical falls due to right lower extremity weakness  ED Course:In the emergency room her patient's vital signs are stable, her blood work shows a creatinine of 1.88 from prior normal values, potassium of 2.9, CT scan of the brain without abnormalities. EKG shows sinus rhythm. EDP discussed with neurology who recommended to be admitted for MRI to rule out stroke, however there may be other issues that contribute to her weakness, and will [facility] 1/16-she is confused     Assessment & Plan:   Active Problems:   TIA (transient ischemic attack)   HTN (hypertension)   AKI (acute kidney injury) (HCC)   Right leg weakness   Dementia  Right lower extremity weakness/recurrent falls/concern for stroke -Unclear timeline when this started, patient's sister just noticed this in the last couple of days however patient does have a history of prior falls, most recent one about a month ago. Mri  showsAtrophy and chronic ischemia. No acute abnormality.  -Continue home aspirin -I see A. fib as part of her medical history however I have no records of that. She is in sinus here. Monitor on telemetry. I do not think she is a good anticoagulation candidate given advanced age/dementia as well as recurrent falls -Urinalysis pending, if positive for UTI will treat  Acute kidney injury -Unclear baseline creatinine, it was normal back in 2011 at 0.95, gentle hydration overnight and repeat renal function in the morning -Obtain renal ultrasound to assess chronic component -Hold Cozaar -renal function improved to 1.36 with hydration.  Hypertension -Hold Cozaar, monitor blood pressure and if persistently high will need alternative agent  Hypokalemia -Potassium repleted by EDP, will recheckblood worktomorrow morning  Dementia -Continue home medications      DVT prophylaxis:heparin Code Status:dnr Family Communication:dw sister Disposition Plan: plan dc when snf bed available Consultants:   none Proceduresnone Antimicrobials:none Subjective: awake  Objective: Vitals:   08/10/17 2056 08/11/17 0017 08/11/17 0429 08/11/17 1420  BP: (!) 152/66 (!) 161/65 (!) 147/70 (!) 159/70  Pulse: 83 80 84 75  Resp: 18 16 20 18   Temp: 98.7 F (37.1 C) 98.5 F (36.9 C) 98.1 F (36.7 C) 98.3 F (36.8 C)  TempSrc: Oral Oral Oral Oral  SpO2: 96% 96% 100% 99%  Weight:      Height:        Intake/Output Summary (Last 24 hours) at 08/11/2017 1518 Last data filed at 08/11/2017 1509 Gross per 24 hour  Intake 2248.75 ml  Output 300 ml  Net 1948.75 ml   Filed Weights   08/09/17 2208  Weight:  70 kg (154 lb 6.4 oz)    Examination:  General exam: Appears calm and comfortable  Respiratory system: Clear to auscultation. Respiratory effort normal. Cardiovascular system: S1 & S2 heard, RRR. No JVD, murmurs, rubs, gallops or clicks. No pedal edema. Gastrointestinal system: Abdomen is  nondistended, soft and nontender. No organomegaly or masses felt. Normal bowel sounds heard. Central nervous system: Alert and oriented. No focal neurological deficits. Extremities: Symmetric 5 x 5 power. Skin: No rashes, lesions or ulcers Psychiatry: Judgement and insight appear normal. Mood & affect appropriate.     Data Reviewed: I have personally reviewed following labs and imaging studies  CBC: Recent Labs  Lab 08/09/17 1413 08/10/17 0612  WBC 11.9* 10.2  NEUTROABS 8.7*  --   HGB 12.4 11.9*  HCT 37.1 36.2  MCV 88.3 89.6  PLT 158 143*   Basic Metabolic Panel: Recent Labs  Lab 08/09/17 1413 08/10/17 0612 08/11/17 0629  NA 144 142 143  K 2.9* 3.8 3.4*  CL 110 111 115*  CO2 26 23 23   GLUCOSE 93 88 86  BUN 25* 27* 25*  CREATININE 1.88* 1.36* 0.91  CALCIUM 8.0* 7.8* 7.7*   GFR: Estimated Creatinine Clearance: 36.2 mL/min (by C-G formula based on SCr of 0.91 mg/dL). Liver Function Tests: Recent Labs  Lab 08/10/17 0612  AST 91*  ALT 39  ALKPHOS 99  BILITOT 1.0  PROT 5.3*  ALBUMIN 2.7*   No results for input(s): LIPASE, AMYLASE in the last 168 hours. No results for input(s): AMMONIA in the last 168 hours. Coagulation Profile: No results for input(s): INR, PROTIME in the last 168 hours. Cardiac Enzymes: No results for input(s): CKTOTAL, CKMB, CKMBINDEX, TROPONINI in the last 168 hours. BNP (last 3 results) No results for input(s): PROBNP in the last 8760 hours. HbA1C: Recent Labs    08/10/17 0612  HGBA1C 4.6*   CBG: No results for input(s): GLUCAP in the last 168 hours. Lipid Profile: Recent Labs    08/10/17 0612  CHOL 121  HDL 32*  LDLCALC 70  TRIG 96  CHOLHDL 3.8   Thyroid Function Tests: No results for input(s): TSH, T4TOTAL, FREET4, T3FREE, THYROIDAB in the last 72 hours. Anemia Panel: No results for input(s): VITAMINB12, FOLATE, FERRITIN, TIBC, IRON, RETICCTPCT in the last 72 hours. Sepsis Labs: No results for input(s): PROCALCITON,  LATICACIDVEN in the last 168 hours.  No results found for this or any previous visit (from the past 240 hour(s)).       Radiology Studies: Mr Brain Wo Contrast  Result Date: 08/10/2017 CLINICAL DATA:  Confusion dementia.  Possible stroke EXAM: MRI HEAD WITHOUT CONTRAST TECHNIQUE: Multiplanar, multiecho pulse sequences of the brain and surrounding structures were obtained without intravenous contrast. COMPARISON:  CT head 08/09/2017 FINDINGS: Brain: Moderate to advanced cerebral atrophy. Negative for acute infarct. Chronic microvascular ischemic change in the white matter, basal ganglia, thalamus, and pons bilaterally. Small chronic infarct right frontal lobe over the convexity. Negative for hemorrhage or mass. No midline shift. Vascular: Normal arterial flow void Skull and upper cervical spine: Negative Sinuses/Orbits: Mucosal edema and retained secretions in the left frontal ethmoid and maxillary sinus unchanged from CT yesterday. Bilateral cataract removal. Other: None IMPRESSION: Atrophy and chronic ischemia.  No acute abnormality. Electronically Signed   By: Marlan Palauharles  Clark M.D.   On: 08/10/2017 12:33   [place] Renal  Result Date: 08/10/2017 CLINICAL DATA:  Renal failure EXAM: RENAL / URINARY TRACT ULTRASOUND COMPLETE COMPARISON:  None. FINDINGS: Right Kidney: Length: 9.1 cm. Echogenicity  within normal limits. No mass. Mild right hydronephrosis. Left Kidney: Length: 9.5 cm. Echogenicity within normal limits. No mass or hydronephrosis visualized. Bladder: The bladder is within normal limits. Bilateral ureteral jets are identified. There is a cystic structure posterior to the bladder measuring 7.3 x 15.2 x 11.4 cm. IMPRESSION: Mild right hydronephrosis. No evidence of left hydronephrosis. Bilateral ureteral jets are identified excluding ureteral obstruction. Large cystic structure is posterior to the bladder. Cystic neoplasm in the pelvis cannot be excluded. Consider CT abdomen and pelvis to further  characterize. Electronically Signed   By: Jolaine Click M.D.   On: 08/10/2017 20:51        Scheduled Meds: .  stroke: mapping our early stages of recovery book   Does not apply Once  . aspirin EC  81 mg Oral Daily  . memantine  28 mg Oral QHS   And  . donepezil  10 mg Oral QHS  . heparin  5,000 Units Subcutaneous Q8H   Continuous Infusions: . sodium chloride 75 mL/hr at 08/11/17 1323     LOS: 0 days       Alwyn Ren, MD  If 7PM-7AM, please contact night-coverage [email or web address] Password [facility] 08/11/2017, 3:18 PM

## 2017-08-12 DIAGNOSIS — E876 Hypokalemia: Secondary | ICD-10-CM

## 2017-08-12 DIAGNOSIS — R296 Repeated falls: Secondary | ICD-10-CM | POA: Diagnosis not present

## 2017-08-12 MED ORDER — MEMANTINE HCL ER 28 MG PO CP24: 28.0000 mg | ORAL_CAPSULE | Freq: Every day | ORAL | 0 refills | Status: DC

## 2017-08-12 MED ORDER — DONEPEZIL HCL 10 MG PO TABS: 10.0000 mg | ORAL_TABLET | Freq: Every day | ORAL | 0 refills | Status: DC

## 2017-08-12 MED ORDER — ACETAMINOPHEN 325 MG PO TABS: 650.0000 mg | ORAL_TABLET | ORAL | Status: DC | PRN

## 2017-08-12 NOTE — Progress Notes (Signed)
LCSW following for SNF placement.   Patient was to dc to [facility] today. [facility]. [facility] under review.  Facility not willing to take an LOG.  LCSW will pass off to evening ED CSW.   If patient doesn't dc today. Weekend Child psychotherapistsocial worker will follow up with [facility] for placement with LOG. LCSW left message and sent text message to facility with no success.   If [facility] cannot accept patient LOG patient will need to go home.   LCSW and RNCM spoke with sister Louanne BeltonCarol Dew about insurance auth and back up plan for tomorrow. RNCM notified sister that patient will need to go home with home health if [facility] cannot accept her.   LCSW and RNCM spoke with sister about LTC facilities and long term medicaid application. LCSW and RNCM encouraged sister to be proactive in patients long term care plan if she continues to feel she cannot care for her at her current level of care.   Morgan Ross, LSCW [facility] CSW [phone]

## 2017-08-12 NOTE — Progress Notes (Signed)
Occupational Therapy Treatment Patient Details Name: Morgan Ross MRN: [id number] DOB: [birth year] Today's Date: 08/12/2017    History of present illness 84 y.o. female with medical history significant of macular degeneration, A. Fib, hypertension, hyperlipidemia, advanced dementia, presents to the hospital with chief complaint of recurrent falls at home.     OT comments  Pt wants to go home but is agreeable to rehab.   Follow Up Recommendations  SNF;Supervision/Assistance - 24 hour    Equipment Recommendations  3 in 1 bedside commode    Recommendations for Other Services      Precautions / Restrictions Precautions Precautions: Fall Restrictions Weight Bearing Restrictions: No       Mobility Bed Mobility Overal bed mobility: Needs Assistance Bed Mobility: Supine to Sit     Supine to sit: Min assist        Transfers Overall transfer level: Needs assistance Equipment used: Rolling walker (2 wheeled) Transfers: Sit to/from [employer] Sit to Stand: Min assist Stand pivot transfers: Min assist       General transfer comment: assist for balance     Balance Overall balance assessment: Needs assistance Sitting-balance support: Feet supported Sitting balance-Leahy Scale: Fair   Postural control: Posterior lean Standing balance support: Bilateral upper extremity supported Standing balance-Leahy Scale: Poor Standing balance comment: reliant on UE support and min A                            ADL either performed or assessed with clinical judgement   ADL Overall ADL's : Needs assistance/impaired Eating/Feeding: Sitting   Grooming: Wash/dry face;Brushing hair;Minimal assistance;Standing                   Toilet Transfer: Minimal assistance;Ambulation;Comfort height toilet;BSC;RW   Toileting- Clothing Manipulation and Hygiene: Sit to/from stand;Minimal assistance         General ADL Comments: pt was in soaked bed when  OT arrived.  Pt had not called for A. Educated pt on calling for A as needed               Cognition Arousal/Alertness: Awake/alert Behavior During Therapy: Flat affect Overall Cognitive Status: No family/caregiver present to determine baseline cognitive functioning                                 General Comments: h/o dementia                    Pertinent Vitals/ Pain       Pain Assessment: No/denies pain     Prior Functioning/Environment              Frequency  Min 2X/week        Progress Toward Goals  OT Goals(current goals can now be found in the care plan section)  Progress towards OT goals: Progressing toward goals  Acute Rehab OT Goals Patient Stated Goal: sister would like her to get stronger and improve balance  OT Goal Formulation: With family Time For Goal Achievement: 08/24/17 Potential to Achieve Goals: Good  Plan Discharge plan remains appropriate       AM-PAC PT "6 Clicks" Daily Activity     Outcome Measure   Help from another person eating meals?: A Little Help from another person taking care of personal grooming?: A Little Help from another person toileting, which includes using toliet, bedpan, or urinal?:  A Little Help from another person bathing (including washing, rinsing, drying)?: A Lot Help from another person to put on and taking off regular upper body clothing?: A Little Help from another person to put on and taking off regular lower body clothing?: A Lot 6 Click Score: 16    End of Session Equipment Utilized During Treatment: Rolling walker  OT Visit Diagnosis: Unsteadiness on feet (R26.81)   Activity Tolerance Patient tolerated treatment well   Patient Left with bed alarm set;in chair;with chair alarm set   Nurse Communication Mobility status;Other (comment)(LE edema )        Time: 1000-1022 OT Time Calculation (min): 22 min  Charges: OT General Charges $OT Visit: 1 Visit OT Treatments $Self  Care/Home Management : 8-22 mins  [place]ori Jaggar [place], [place] [phone]   Alba CoryREDDING, Asim Gersten D 08/12/2017, 11:34 AM

## 2017-08-12 NOTE — Progress Notes (Signed)
LCSW following for SNF placement.  [facility].   Patient needs pre auth. Facility has started pre [place].

## 2017-08-12 NOTE — Progress Notes (Addendum)
CSW received a call from pt's RN stating pt is awaiting, per the 1st shift CSW, auth number from [facility] for [facility].  Per notes, if the authorization is denied or there is no answer by 1/18 the weekend CSW is to faciliate D[facility] [phone] or [phone] using an LOG (CSW Please contact CSW Asst Director and/or Dr. Jacky KindleAronson [phone] prior to this process with [facility]).  Per notes if pt is unable to D/C on 1/19 pt is to D/C home.  *Please see previous CSW notes*  7:06 PM 1/18 CSW called [facility] and per [facility] at [facility] they are still awaiting return call from [facility] with answer on insurance authorization number.  CSW wil update pt's RN/CN at [phone] if Alinda Moneyony at [facility] doesn't call back by 7:30 with an [place] number.  CSW will continue to follow for D/C needs.  8:12 PM 1/18 No authorization number from [facility] was forthcoming from [place] [facility] AND IS NOT EXPECTED OVER THE WEEKEND AS this CSW believes [facility] offices will be closed.  Plan: Pt is to be D/C'd to [facility] on Sat 1/19 with an LOG (with agreement from CSW Asst Director and Dr. Jacky KindleAronson) and if this is not possible pt is to be D/C'd home, per notes from 1st shift CSW.  See previous notes.  CSW has updated pt's RN/CN on [address] [place].  CSW will leave handoff for weekend CSW.  Dorothe PeaJonathan F. Xavian Hardcastle, LCSW, LCAS, CSI 2nd shift [facility] Clinical Social Worker Ph: [phone]

## 2017-08-12 NOTE — Discharge Summary (Addendum)
Physician Discharge Summary  Geralyn CorwinVirginia S Antolin ZOX:[id number] DOB: 1923/01/21 DOA: 08/09/2017  PCP: Clovis RileyMitchell, L.August Saucerean, MD  Admit date: 08/09/2017 Discharge date: 08/13/2016 Admitted From: Home Disposition: Home  Recommendations for Outpatient Follow-up:  1. Follow up with PCP in 1-2 weeks 2. Please obtain BMP/CBC in one week  Home Health:Equipment/Devices: None Discharge Condition stable CODE STATUS: DNR Diet recommendation: Cardiac Brief/Interim Summary:84 y.o.femalewith medical history significant of?A. Fib,hypertension, hyperlipidemia, advanced dementia, presents to the hospital with chief complaint of recurrent falls at home. Patient has underlying dementia and is unable to contribute to the story, sister who is the main caregiver is at bedside and she tells me that patient has been having recurrent falls over the last couple of days. Patient's sister also noticed that her right leg has been weaker in the last couple of days, however she is not exactly sure when this started. Patient is pleasantly demented, and has no complaints, denies any pain. She does state that her right leg feels heavier than normal. There are no reported fever or chills, no abdominal pain nausea vomiting or diarrhea. No reported chest pains or palpitations. No reported syncopal episodes it appears that she has mechanical falls due to right lower extremity weakness  ED Course:In the emergency room her patient's vital signs are stable, her blood work shows a creatinine of 1.88 from prior normal values, potassium of 2.9, CT scan of the brain without abnormalities. EKG shows sinus rhythm. EDP discussed with neurology who recommended to be admitted for MRI to rule out stroke, however there may be other issues that contribute to her weakness, and [facility]-she is confused  Discharge Diagnoses:  Active Problems:   TIA (transient ischemic attack)   HTN (hypertension)    AKI (acute kidney injury) (HCC)   Right leg weakness   Dementia 1]Right lower extremity weakness/recurrent falls/concern for stroke Mri showsAtrophy and chronic ischemia. No acute abnormality.  Some of her symptoms could have been exacerbated by UTI.  -Continue home aspirin  UTI-E. Coli.   treat her with Vantin. 2]Acute kidney injury-improved with IV hydration creatinine down to 0.91. -- renal ultrasound SHOWSMild right hydronephrosis. No evidence of left hydronephrosis. Bilateral ureteral jets are identified excluding ureteral obstruction.  Large cystic structure is posterior to the bladder. Cystic neoplasm in the pelvis cannot be excluded. Consider CT abdomen and pelvis to further characterize. Discussed with patient's sister.  Would hold any aggressive treatment.  A CT scan of the abdomen and pelvis was not done for this reason.  Will arrange for outpatient follow-up with urology. 3] Hypertension restart Cozaar  4]Hypokalemia resolved 5]Dementia continue Aricept and Namenda. Severe deconditioning patient seen by PT and OT recommends rehab to DC patient to skilled nursing facility for rehabilitation.  This was discussed with patient's sister.  Patient lives with her elderly sister who is 82 years old and is having her own health problems and said that she will not be able to take care of the patient at home unless she is able to start walking again.    Discharge Instructions   Allergies as of 08/12/2017   No Known Allergies     Medication List    STOP taking these medications   predniSONE 20 MG tablet Commonly known as:  DELTASONE     TAKE these medications   acetaminophen 325 MG tablet Commonly known as:  TYLENOL Take 2 tablets (650 mg total) by mouth every 4 (four) hours as needed for mild pain (or temp >  37.5 C (99.5 F)).   aspirin 81 MG tablet Take 81 mg by mouth daily.   CALCIUM 1200 PO Take 1,200 mg by mouth daily.   donepezil 10 MG tablet Commonly  known as:  ARICEPT Take 1 tablet (10 mg total) by mouth at bedtime.   losartan 50 MG tablet Commonly known as:  COZAAR Take 50 mg by mouth daily.   memantine 28 MG Cp24 24 hr capsule Commonly known as:  NAMENDA XR Take 1 capsule (28 mg total) by mouth at bedtime.   NAMZARIC 28-10 MG Cp24 Generic drug:  Memantine HCl-Donepezil HCl Take 1 tablet by mouth daily.   PRESERVISION AREDS PO Take 1 tablet by mouth daily.       No Known Allergies  Consultations: NONE  Procedures/Studies: Ct Head Wo Contrast  Result Date: 08/09/2017 CLINICAL DATA:  Multiple unwitnessed falls over the last 3 days. History of dementia. EXAM: CT HEAD WITHOUT CONTRAST TECHNIQUE: Contiguous axial images were obtained from the base of the skull through the vertex without intravenous contrast. COMPARISON:  None. FINDINGS: Brain: Small region of poor gray-white differentiation in the right frontal lobe for example image 9/2 compatible with age indeterminate but more likely chronic infarct. Left thalamic remote lacunar infarct 0.8 by 0.5 cm on image 15/2. Otherwise, The brainstem, cerebellum, cerebral peduncles, thalami, basal ganglia, basilar cisterns, and ventricular system appear within normal limits. Periventricular white matter and corona radiata hypodensities favor chronic ischemic microvascular white matter disease. No intracranial hemorrhage, mass lesion, or acute CVA. Vascular: There is atherosclerotic calcification of the cavernous carotid arteries bilaterally. Skull: Unremarkable Sinuses/Orbits: Complete opacification of the visualized part of the left maxillary sinus. Opacification of multiple ethmoid air cells. Complete opacification of the visualized part of the left frontal sinus. Other: No supplemental non-categorized findings. IMPRESSION: 1. No acute intracranial hemorrhage or specific acute intracranial findings. 2. Remote lacunar infarct in the left thalamus. Small region of poor gray-white  differentiation in the right frontal lobe is probably a small remote infarct but is essentially age indeterminate. 3. Complete opacification of the visualized part of the left maxillary sinus and also of the left frontal sinus. This could reflect acute or chronic sinusitis. Chronic ethmoid sinusitis is also present. Electronically Signed   By: Gaylyn Rong M.D.   On: 08/09/2017 15:17   Mr Brain Wo Contrast  Result Date: 08/10/2017 CLINICAL DATA:  Confusion dementia.  Possible stroke EXAM: MRI HEAD WITHOUT CONTRAST TECHNIQUE: Multiplanar, multiecho pulse sequences of the brain and surrounding structures were obtained without intravenous contrast. COMPARISON:  CT head 08/09/2017 FINDINGS: Brain: Moderate to advanced cerebral atrophy. Negative for acute infarct. Chronic microvascular ischemic change in the white matter, basal ganglia, thalamus, and pons bilaterally. Small chronic infarct right frontal lobe over the convexity. Negative for hemorrhage or mass. No midline shift. Vascular: Normal arterial flow void Skull and upper cervical spine: Negative Sinuses/Orbits: Mucosal edema and retained secretions in the left frontal ethmoid and maxillary sinus unchanged from CT yesterday. Bilateral cataract removal. Other: None IMPRESSION: Atrophy and chronic ischemia.  No acute abnormality. Electronically Signed   By: Marlan Palau M.D.   On: 08/10/2017 12:33   US Renal  Result Date: 08/10/2017 CLINICAL DATA:  Renal failure EXAM: RENAL / URINARY TRACT ULTRASOUND COMPLETE COMPARISON:  None. FINDINGS: Right Kidney: Length: 9.1 cm. Echogenicity within normal limits. No mass. Mild right hydronephrosis. Left Kidney: Length: 9.5 cm. Echogenicity within normal limits. No mass or hydronephrosis visualized. Bladder: The bladder is within normal limits. Bilateral ureteral jets  are identified. There is a cystic structure posterior to the bladder measuring 7.3 x 15.2 x 11.4 cm. IMPRESSION: Mild right hydronephrosis. No  evidence of left hydronephrosis. Bilateral ureteral jets are identified excluding ureteral obstruction. Large cystic structure is posterior to the bladder. Cystic neoplasm in the pelvis cannot be excluded. Consider CT abdomen and pelvis to further characterize. Electronically Signed   By: Jolaine Click M.D.   On: 08/10/2017 20:51    (Echo, Carotid, EGD, Colonoscopy, ERCP)    Subjective:   Discharge Exam: Vitals:   08/11/17 2057 08/12/17 0433  BP: (!) 155/71 (!) 168/63  Pulse: 83 70  Resp: 18 15  Temp: 98.1 F (36.7 C) 97.8 F (36.6 C)  SpO2: 97% 98%   Vitals:   08/11/17 0429 08/11/17 1420 08/11/17 2057 08/12/17 0433  BP: (!) 147/70 (!) 159/70 (!) 155/71 (!) 168/63  Pulse: 84 75 83 70  Resp: 20 18 18 15   Temp: 98.1 F (36.7 C) 98.3 F (36.8 C) 98.1 F (36.7 C) 97.8 F (36.6 C)  TempSrc: Oral Oral Oral Oral  SpO2: 100% 99% 97% 98%  Weight:      Height:        General: Pt is alert, awake, not in acute distress Cardiovascular: RRR, S1/S2 +, no rubs, no gallops Respiratory: CTA bilaterally, no wheezing, no rhonchi Abdominal: Soft, NT, ND, bowel sounds + Extremities: no edema, no cyanosis    The results of significant diagnostics from this hospitalization (including imaging, microbiology, ancillary and laboratory) are listed below for reference.     Microbiology: Recent Results (from the past 240 hour(s))  Culture, Urine     Status: Abnormal (Preliminary result)   Collection Time: 08/10/17  8:50 AM  Result Value Ref Range Status   Specimen Description URINE, RANDOM  Final   Special Requests NONE  Final   Culture (A)  Final    >=100,000 COLONIES/mL ESCHERICHIA COLI SUSCEPTIBILITIES TO FOLLOW Performed at [facility], [address], [place], [place] [address]    Report Status PENDING  Incomplete     Labs: BNP (last 3 results) No results for input(s): BNP in the last 8760 hours. Basic Metabolic Panel: Recent Labs  Lab 08/09/17 1413 08/10/17 0612  08/11/17 0629  NA 144 142 143  K 2.9* 3.8 3.4*  CL 110 111 115*  CO2 26 23 23   GLUCOSE 93 88 86  BUN 25* 27* 25*  CREATININE 1.88* 1.36* 0.91  CALCIUM 8.0* 7.8* 7.7*   Liver Function Tests: Recent Labs  Lab 08/10/17 0612  AST 91*  ALT 39  ALKPHOS 99  BILITOT 1.0  PROT 5.3*  ALBUMIN 2.7*   No results for input(s): LIPASE, AMYLASE in the last 168 hours. No results for input(s): AMMONIA in the last 168 hours. CBC: Recent Labs  Lab 08/09/17 1413 08/10/17 0612  WBC 11.9* 10.2  NEUTROABS 8.7*  --   HGB 12.4 11.9*  HCT 37.1 36.2  MCV 88.3 89.6  PLT 158 143*   Cardiac Enzymes: No results for input(s): CKTOTAL, CKMB, CKMBINDEX, TROPONINI in the last 168 hours. BNP: Invalid input(s): POCBNP CBG: No results for input(s): GLUCAP in the last 168 hours. D-Dimer No results for input(s): DDIMER in the last 72 hours. Hgb A1c Recent Labs    08/10/17 0612  HGBA1C 4.6*   Lipid Profile Recent Labs    08/10/17 0612  CHOL 121  HDL 32*  LDLCALC 70  TRIG 96  CHOLHDL 3.8   Thyroid function studies No results  for input(s): TSH, T4TOTAL, T3FREE, THYROIDAB in the last 72 hours.  Invalid input(s): FREET3 Anemia work up No results for input(s): VITAMINB12, FOLATE, FERRITIN, TIBC, IRON, RETICCTPCT in the last 72 hours. Urinalysis    Component Value Date/Time   COLORURINE AMBER (A) 08/09/2017 2210   APPEARANCEUR CLOUDY (A) 08/09/2017 2210   LABSPEC 1.020 08/09/2017 2210   PHURINE 6.0 08/09/2017 2210   GLUCOSEU NEGATIVE 08/09/2017 2210   HGBUR SMALL (A) 08/09/2017 2210   BILIRUBINUR NEGATIVE 08/09/2017 2210   KETONESUR 5 (A) 08/09/2017 2210   PROTEINUR 100 (A) 08/09/2017 2210   NITRITE NEGATIVE 08/09/2017 2210   LEUKOCYTESUR LARGE (A) 08/09/2017 2210   Sepsis Labs Invalid input(s): PROCALCITONIN,  WBC,  LACTICIDVEN Microbiology Recent Results (from the past 240 hour(s))  Culture, Urine     Status: Abnormal (Preliminary result)   Collection Time: 08/10/17  8:50 AM   Result Value Ref Range Status   Specimen Description URINE, RANDOM  Final   Special Requests NONE  Final   Culture (A)  Final    >=100,000 COLONIES/mL ESCHERICHIA COLI SUSCEPTIBILITIES TO FOLLOW Performed at [facility], [address], [place], [place] [address]    Report Status PENDING  Incomplete     Time coordinating discharge: Over 30 minutes  SIGNED:   Alwyn Ren, MD  [facility] 08/12/2017, 12:08 PM Pager   If 7PM-7AM, please contact night-coverage [email or web address] Password [email or web address]

## 2017-08-12 NOTE — Progress Notes (Signed)
Upon arrival to patient's room, patient's IV was hanging on the side of the bed. IV removed. Fluids stopped. MD paged and received orders to discontinue to fluids. Patient is waiting for a bed at SNF. RN will continue to monitor the patient.

## 2017-08-13 DIAGNOSIS — F0390 Unspecified dementia without behavioral disturbance: Secondary | ICD-10-CM | POA: Diagnosis not present

## 2017-08-13 DIAGNOSIS — R296 Repeated falls: Secondary | ICD-10-CM | POA: Diagnosis not present

## 2017-08-13 DIAGNOSIS — I10 Essential (primary) hypertension: Secondary | ICD-10-CM | POA: Diagnosis not present

## 2017-08-13 MED ORDER — CEFPODOXIME PROXETIL 200 MG PO TABS: 200.0000 mg | ORAL_TABLET | Freq: Two times a day (BID) | ORAL | 0 refills | Status: DC

## 2017-08-13 MED ORDER — CEFPODOXIME PROXETIL 200 MG PO TABS
200.0000 mg | ORAL_TABLET | Freq: Two times a day (BID) | ORAL | Status: DC
  Administered 2017-08-13: 200 mg via ORAL
  Filled 2017-08-13: qty 1

## 2017-08-13 NOTE — Clinical Social Work Placement (Signed)
   CLINICAL SOCIAL WORK PLACEMENT  NOTE  Date:  08/13/2017  Patient Details  Name: Morgan Ross MRN: [id number] Date of Birth: November 03, 1922  Clinical Social Work is seeking post-discharge placement for this patient at the Skilled  Nursing Facility level of care (*CSW will initial, date and re-position this form in  chart as items are completed):  Yes   Patient/family provided with [place] Clinical Social Work Department's list of facilities offering this level of care within the geographic area requested by the patient (or if unable, by the patient's family).  Yes   Patient/family informed of their freedom to choose among providers that offer the needed level of care, that participate in Medicare, Medicaid or managed care program needed by the patient, have an available bed and are willing to accept the patient.  Yes   Patient/family informed of [facility]'s ownership interest in [facility] and [facility], as well as of the fact that they are under no obligation to receive care at these facilities.  PASRR submitted to EDS on       PASRR number received on 08/11/17     Existing PASRR number confirmed on       FL2 transmitted to all facilities in geographic area requested by pt/family on 08/11/17     FL2 transmitted to all facilities within larger geographic area on       Patient informed that his/her managed care company has contracts with or will negotiate with certain facilities, including the following:        Yes   Patient/family informed of bed offers received.  [facility]     Physician recommends and patient chooses [facility]    [facility] on 08/13/2017  Patient to be transferred to facility by EMS     Patient family notified on 08/13/2017 of transfer.  Name of family member notified:  Morgan Ross, Sister, at the bedside.     PHYSICIAN        Additional Comment:    _______________________________________________ Cristobal Goldmannrawford, Josilynn Losh Bradley, LCSW 08/13/2017, 2:26 PM

## 2017-08-13 NOTE — Progress Notes (Signed)
PROGRESS NOTE    [place]  WUJ:[id number] DOB: 1923/02/27 DOA: 08/09/2017 PCP: Clovis Riley, L.August Saucer, MD  Brief Narrative: :84 y.o.femalewith medical history significant of?A. Fib,hypertension, hyperlipidemia, advanced dementia, presents to the hospital with chief complaint of recurrent falls at home. Patient has underlying dementia and is unable to contribute to the story, sister who is the main caregiver is at bedside and she tells me that patient has been having recurrent falls over the last couple of days. Patient's sister also noticed that her right leg has been weaker in the last couple of days, however she is not exactly sure when this started. Patient is pleasantly demented, and has no complaints, denies any pain. She does state that her right leg feels heavier than normal. There are no reported fever or chills, no abdominal pain nausea vomiting or diarrhea. No reported chest pains or palpitations. No reported syncopal episodes it appears that she has mechanical falls due to right lower extremity weakness  ED Course:In the emergency room her patient's vital signs are stable, her blood work shows a creatinine of 1.88 from prior normal values, potassium of 2.9, CT scan of the brain without abnormalities. EKG shows sinus rhythm. EDP discussed with neurology who recommended to be admitted for MRI to rule out stroke, however there may be other issues that contribute to her weakness, and will admit patient to [facility] hospital 1/16-she is confused    Assessment & Plan:   Active Problems:   TIA (transient ischemic attack)   HTN (hypertension)   AKI (acute kidney injury) (HCC)   Right leg weakness   Dementia   Hypokalemia   Frequent falls    Dementia 1]Right lower extremity weakness/recurrent falls/concern for stroke Mri showsAtrophy and chronic ischemia. No acute abnormality.  Some of her symptoms could have been exacerbated by UTI.  -Continue home aspirin  UTI-E. Coli.   treat her with Vantin. 2]Acute kidney injury-improved with IV hydration creatinine down to 0.91. -- renal ultrasound SHOWSMild right hydronephrosis. No evidence of left hydronephrosis. Bilateral ureteral jets are identified excluding ureteral obstruction.  Large cystic structure is posterior to the bladder. Cystic neoplasm in the pelvis cannot be excluded. Consider CT abdomen and pelvis to further characterize. Discussed with patient's sister.  Would hold any aggressive treatment.  A CT scan of the abdomen and pelvis was not done for this reason.  Will arrange for outpatient follow-up with urology. 3] Hypertension restart Cozaar  4]Hypokalemia resolved 5]Dementia continue Aricept and Namenda. Severe deconditioning patient seen by PT and OT recommends rehab to DC patient to skilled nursing facility for rehabilitation.  This was discussed with patient's sister.  Patient lives with her elderly sister who is 94 years old and is having her own health problems and said that she will not be able to take care of the patient at home unless she is able to start walking again.      DVT prophylaxis: sq heparin Code Status:dnr Family Communication: dw sister Disposition Plan:dc to snf Consultants:  none Procedures:  Antimicrobials:vantin  Subjective:no complaints   Objective: Vitals:   08/12/17 0433 08/12/17 1400 08/12/17 2112 08/13/17 0518  BP: (!) 168/63 (!) 154/67 (!) 161/86 (!) 128/56  Pulse: 70 80 77 77  Resp: 15 16 16 18   Temp: 97.8 F (36.6 C) 98.2 F (36.8 C) 98.5 F (36.9 C) 98.5 F (36.9 C)  TempSrc: Oral  Oral Oral  SpO2: 98% 99% 97% 97%  Weight:      Height:  Intake/Output Summary (Last 24 hours) at 08/13/2017 0927 Last data filed at 08/12/2017 1731 Gross per 24 hour  Intake 798.75 ml  Output -  Net 798.75 ml   Filed Weights   08/09/17 2208  Weight: 70 kg (154 lb 6.4 oz)    Examination:  General exam: Appears calm and comfortable    Respiratory system: Clear to auscultation. Respiratory effort normal. Cardiovascular system: S1 & S2 heard, RRR. No JVD, murmurs, rubs, gallops or clicks. No pedal edema. Gastrointestinal system: Abdomen is nondistended, soft and nontender. No organomegaly or masses felt. Normal bowel sounds heard. Central nervous system: Alert and oriented. No focal neurological deficits. Extremities: Symmetric 5 x 5 power. Skin: No rashes, lesions or ulcers Psychiatry: Judgement and insight appear normal. Mood & affect appropriate.     Data Reviewed: I have personally reviewed following labs and imaging studies  CBC: Recent Labs  Lab 08/09/17 1413 08/10/17 0612  WBC 11.9* 10.2  NEUTROABS 8.7*  --   HGB 12.4 11.9*  HCT 37.1 36.2  MCV 88.3 89.6  PLT 158 143*   Basic Metabolic Panel: Recent Labs  Lab 08/09/17 1413 08/10/17 0612 08/11/17 0629  NA 144 142 143  K 2.9* 3.8 3.4*  CL 110 111 115*  CO2 26 23 23   GLUCOSE 93 88 86  BUN 25* 27* 25*  CREATININE 1.88* 1.36* 0.91  CALCIUM 8.0* 7.8* 7.7*   GFR: Estimated Creatinine Clearance: 36.2 mL/min (by C-G formula based on SCr of 0.91 mg/dL). Liver Function Tests: Recent Labs  Lab 08/10/17 0612  AST 91*  ALT 39  ALKPHOS 99  BILITOT 1.0  PROT 5.3*  ALBUMIN 2.7*   No results for input(s): LIPASE, AMYLASE in the last 168 hours. No results for input(s): AMMONIA in the last 168 hours. Coagulation Profile: No results for input(s): INR, PROTIME in the last 168 hours. Cardiac Enzymes: No results for input(s): CKTOTAL, CKMB, CKMBINDEX, TROPONINI in the last 168 hours. BNP (last 3 results) No results for input(s): PROBNP in the last 8760 hours. HbA1C: No results for input(s): HGBA1C in the last 72 hours. CBG: No results for input(s): GLUCAP in the last 168 hours. Lipid Profile: No results for input(s): CHOL, HDL, LDLCALC, TRIG, CHOLHDL, LDLDIRECT in the last 72 hours. Thyroid Function Tests: No results for input(s): TSH, T4TOTAL,  FREET4, T3FREE, THYROIDAB in the last 72 hours. Anemia Panel: No results for input(s): VITAMINB12, FOLATE, FERRITIN, TIBC, IRON, RETICCTPCT in the last 72 hours. Sepsis Labs: No results for input(s): PROCALCITON, LATICACIDVEN in the last 168 hours.  Recent Results (from the past 240 hour(s))  Culture, Urine     Status: Abnormal (Preliminary result)   Collection Time: 08/10/17  8:50 AM  Result Value Ref Range Status   Specimen Description URINE, RANDOM  Final   Special Requests NONE  Final   Culture (A)  Final    >=100,000 COLONIES/mL ESCHERICHIA COLI SUSCEPTIBILITIES TO FOLLOW Performed at [facility], [address] [place], [place] [address]    Report Status PENDING  Incomplete         Radiology Studies: No results found.      Scheduled Meds: .  stroke: mapping our early stages of recovery book   Does not apply Once  . aspirin EC  81 mg Oral Daily  . memantine  28 mg Oral QHS   And  . donepezil  10 mg Oral QHS  . heparin  5,000 Units Subcutaneous Q8H   Continuous Infusions: . sodium chloride Stopped (  08/12/17 1200)     LOS: 0 days     Alwyn Ren, MD [facility]  If 7PM-7AM, please contact night-coverage [email or web address] Password [facility] 08/13/2017, 9:27 AM

## 2017-08-13 NOTE — Progress Notes (Signed)
[place] [facility].  All questions answered.

## 2017-08-13 NOTE — Care Management Note (Signed)
Case Management Note  Patient Details  Name: Morgan CorwinVirginia S Ross MRN: [id number] Date of Birth: [birth year]  Subjective/Objective:      AKI, recurring falls              Action/Plan: Discharge Planning: Spoke to CSW and SNF was approved. CSW following for SNF placement.   Expected Discharge Date:  08/12/17               Expected Discharge Plan:  Skilled Nursing Facility  In-House Referral:  Clinical Social Work  Discharge planning Services  CM Consult  Post Acute Care Choice:  NA Choice offered to:  NA  DME Arranged:  N/A DME Agency:  NA  HH Arranged:  NA HH Agency:  NA  Status of Service:  Completed, signed off  If discussed at [facility] Length of Stay Meetings, dates discussed:    Additional Comments:  Elliot CousinShavis, Blakely Maranan Ellen, RN 08/13/2017, 11:21 AM

## 2017-08-15 LAB — URINE CULTURE

## 2017-08-16 ENCOUNTER — Non-Acute Institutional Stay (SKILLED_NURSING_FACILITY): Payer: Medicare Other | Admitting: Internal Medicine

## 2017-08-16 ENCOUNTER — Encounter: Payer: Self-pay | Admitting: Internal Medicine

## 2017-08-16 ENCOUNTER — Other Ambulatory Visit: Payer: Self-pay | Admitting: Internal Medicine

## 2017-08-16 DIAGNOSIS — B9620 Unspecified Escherichia coli [E. coli] as the cause of diseases classified elsewhere: Secondary | ICD-10-CM

## 2017-08-16 DIAGNOSIS — R1907 Generalized intra-abdominal and pelvic swelling, mass and lump: Secondary | ICD-10-CM

## 2017-08-16 DIAGNOSIS — R1900 Intra-abdominal and pelvic swelling, mass and lump, unspecified site: Secondary | ICD-10-CM | POA: Diagnosis not present

## 2017-08-16 DIAGNOSIS — F0390 Unspecified dementia without behavioral disturbance: Secondary | ICD-10-CM

## 2017-08-16 DIAGNOSIS — I10 Essential (primary) hypertension: Secondary | ICD-10-CM

## 2017-08-16 DIAGNOSIS — N390 Urinary tract infection, site not specified: Secondary | ICD-10-CM

## 2017-08-16 DIAGNOSIS — R296 Repeated falls: Secondary | ICD-10-CM

## 2017-08-16 NOTE — Assessment & Plan Note (Signed)
PT/OT at SNF °

## 2017-08-16 NOTE — Assessment & Plan Note (Addendum)
Clinically I felt I could palpate the mass in the suprapubic area  Urology outpatient evaluation was to be considered, but if CT is not to be pursued this seems somewhat illogical Discuss with sister

## 2017-08-16 NOTE — Assessment & Plan Note (Signed)
D/C Namzaric

## 2017-08-16 NOTE — Progress Notes (Signed)
NURSING HOME LOCATION:  [facility] ROOM NUMBER:  [id number]  CODE STATUS:  DNR  PCP:  Clovis Riley, L.August Saucer, MD  [address] [place] [address]   This is a comprehensive admission note to [facility] performed on this date less than 30 days from date of admission. Included are preadmission medical/surgical history;reconciled medication list; family history; social history and comprehensive review of systems.  Corrections and additions to the records were documented . Comprehensive physical exam was also performed. Additionally a clinical summary was entered for each active diagnosis pertinent to this admission in the Problem List to enhance continuity of care.  HPI: The patient was hospitalized 1/15-1/19/18 because of recurrent falls at home. This is superimposed on a baseline dementia. Sister is main caregiver, she described recurrent falls over several days prior to admission. The sister noted that the patient's leg has seemingly been weaker during that period. Obviously the history is incomplete but is no definite cardiac or neurologic prodrome prior to the falls and they're felt to be mechanical in nature. Labs revealed a creatinine of 1.88 & K+ 2.9. With IV hydration creatinine dropped to 0.91 .No acute changes were noted on CT of the head. MRI revealed atrophy and chronic ischemia. There was no acute abnormality. She was found to have an Escherichia coli UTI for which the Vantin was prescribed. Sound revealed mild right hydronephrosis. Large cystic lesion was noted posterior to the bladder on renal US. Possible cystic neoplasm was question. The patient's sister did not want to pursue aggressive evaluation and intervention ; therefore, CT scan of the abdomen and pelvis was not done. Outpatient follow-up by urology was recommended.  Cozaar was started for hypertension. PT and OT recommended SNF placement because of severe deconditioning. The sister agreed to this  as the sister is 87 years old with significant health issues. She would not be able to take care of the patient at home unless the patient were ambulatory. Namenda/ Aricept combination was continued for the dementia.  Past medical and surgical history:Include TIA, stroke, macular degeneration, dyslipidemia, diabetes, history of atrial fibrillation, GERD and depression. No surgical history is on file.  Social history:The patient apparently smoked for 15 years. She does not drink.  Family history:Limited history reviewed  Review of systems:Could not be completed due to dementia. Patient was oriented to self only. She does state that she has occasional pain in her knees. She also described occasional itching. She had no other extrinsic symptoms.  Constitutional: No fever,significant weight change, fatigue  Eyes: No redness, discharge, pain, vision change ENT/mouth: No nasal congestion,  purulent discharge, earache,change in hearing ,sore throat  Cardiovascular: No chest pain, palpitations,paroxysmal nocturnal dyspnea, claudication, edema  Respiratory: No cough, sputum production,hemoptysis, DOE , significant snoring,apnea  Gastrointestinal: No heartburn,dysphagia,abdominal pain, nausea / vomiting,rectal bleeding, melena,change in bowels Genitourinary: No dysuria,hematuria, pyuria,  incontinence, nocturia Dermatologic: No rash, pruritus, change in appearance of skin Neurologic: No dizziness,headache,syncope, seizures, numbness , tingling Psychiatric: No significant anxiety , depression, insomnia, anorexia Endocrine: No change in hair/skin/ nails, excessive thirst, excessive hunger, excessive urination  Hematologic/lymphatic: No significant bruising, lymphadenopathy,abnormal bleeding Allergy/immunology: No itchy/ watery eyes, significant sneezing, urticaria, angioedema  Physical exam:  Pertinent or positive findings:Pupils are pinpoint. She appears pale. There is a small eschar over the lower  lip. She has poor dentition of the teeth with caries .She has a grade 1-1.5 systolic murmur with radiation into the right carotid. She had minimal rales, the chest was surprisingly clear.  There is a suggestion of suprapubic mass approximately 6 x 6 cm. This is nontender. Varus changes are present. Chest slight enlargement of the knees, greater on the right than the left. The right lower extremity appears swollen compared to the left. Homans sign is negative. She has mild erythema of the right leg below the knee with scattered excoriations. Pedal pulses are decreased. She has mixed arthritic changes in the DIP/PIP joints of the left hand.   General appearance:Adequately nourished; no acute distress , increased work of breathing is present.   Lymphatic: No lymphadenopathy about the head, neck, axilla . Eyes: No conjunctival inflammation or lid edema is present. There is no scleral icterus. Ears:  External ear exam shows no significant lesions or deformities.   Nose:  External nasal examination shows no deformity or inflammation. Nasal mucosa are pink and moist without lesions ,exudates Oral exam: lips and gums are healthy appearing.There is no oropharyngeal erythema or exudate . Neck:  No thyromegaly, masses, tenderness noted.    Heart:  Normal rate and regular rhythm. S1 and S2 normal without gallop, click, rub .  Lungs: without wheezes, rhonchi, rubs. Abdomen:Bowel sounds are normal. Abdomen is soft and nontender with no organomegaly, hernias,masses. GU: deferred  Extremities:  No cyanosis, clubbing,edema  Neurologic exam : Strength equal  in upper & lower extremities but weak Balance,Rhomberg,finger to nose testing could not be completed due to clinical state Deep tendon reflexes are equal Skin: Warm & dry w/o tenting.  See clinical summary under each active problem in the Problem List with associated updated therapeutic plan

## 2017-08-16 NOTE — Assessment & Plan Note (Signed)
BP controlled; no change in antihypertensive medications  

## 2017-08-16 NOTE — Patient Instructions (Addendum)
See assessment and plan under each diagnosis in the problem list and acutely for this visit Decrease Tylenol to 500 mg every 6 hours as needed to prevent potential hepatotoxicity Lubriderm every shift  For 10 days for excessive skin drying

## 2017-08-17 LAB — BASIC METABOLIC PANEL
BUN: 26 — AB (ref 4–21)
CREATININE: 0.8 (ref 0.5–1.1)
GLUCOSE: 91
Potassium: 4 (ref 3.4–5.3)
Sodium: 144 (ref 137–147)

## 2017-08-19 NOTE — Progress Notes (Signed)
06/27/18

## 2017-08-25 ENCOUNTER — Other Ambulatory Visit: Payer: Self-pay | Admitting: Internal Medicine

## 2017-08-25 DIAGNOSIS — N1330 Unspecified hydronephrosis: Secondary | ICD-10-CM

## 2017-09-01 ENCOUNTER — Non-Acute Institutional Stay (SKILLED_NURSING_FACILITY): Payer: Medicare Other | Admitting: Internal Medicine

## 2017-09-01 ENCOUNTER — Encounter: Payer: Self-pay | Admitting: Internal Medicine

## 2017-09-01 DIAGNOSIS — G459 Transient cerebral ischemic attack, unspecified: Secondary | ICD-10-CM

## 2017-09-01 DIAGNOSIS — Z9189 Other specified personal risk factors, not elsewhere classified: Secondary | ICD-10-CM | POA: Diagnosis not present

## 2017-09-01 DIAGNOSIS — R1900 Intra-abdominal and pelvic swelling, mass and lump, unspecified site: Secondary | ICD-10-CM

## 2017-09-01 NOTE — Assessment & Plan Note (Signed)
CT of abdomen and pelvis scheduled for 09/12/17

## 2017-09-01 NOTE — Progress Notes (Signed)
NURSING HOME LOCATION:  [facility] ROOM NUMBER:  [id number]  CODE STATUS:  DNR  PCP:  Clovis Riley, L.August Saucer, MD [address] [place] [address]  This is a nursing facility follow up for specific acute issue of weakness in the right upper extremity as per staff.  Interim medical record and care since last [facility] visit was updated with review of diagnostic studies and change in clinical status since last visit were documented.  HPI: The patient has advanced cns vascular disease; CAT scan 08/09/17 revealed remote lacunar infarct in the left thalamus, small right frontal remote infarct, and left maxillary and frontal sinusitis .MRI 1/16 revealed severe atrophy and chronic ischemia. The patient has been on Namzaric, combination of Aricept and Namenda.. Her sister Mrs. Dew states that she has seen no benefit on this medicine. The sister did state that her brother was placed on a patch( ? possibly Exelon) for dementia with benefit. Today Staff felt that the patient's right upper extremity was weaker than on prior evaluations, prompting the evaluation. The patient is on low-dose aspirin prophylaxis. Based on medication reconciliation,the present acetaminophen dose has potential for hepatotoxicity.  Review of systems: Dementia invalidated responses. The year given as 1918 and the president as Erskine Squibb. Her sister states that she's had chronic dermatitis. No diagnosis was made as patient refused to go to the dermatologic visit which was scheduled.   Constitutional: No fever,significant weight change, fatigue  Eyes: No redness, discharge, pain, vision change ENT/mouth: No nasal congestion,  purulent discharge, earache,change in hearing ,sore throat  Cardiovascular: No chest pain, palpitations,paroxysmal nocturnal dyspnea, claudication, edema  Respiratory: No cough, sputum production,hemoptysis, DOE , significant snoring,apnea  Gastrointestinal: No  heartburn,dysphagia,abdominal pain, nausea / vomiting,rectal bleeding, melena,change in bowels Genitourinary: No dysuria,hematuria, pyuria,  incontinence, nocturia Musculoskeletal: No joint stiffness, joint swelling, weakness,pain Dermatologic: No pruritus,new change in appearance of skin Neurologic: No dizziness,headache,syncope, seizures, numbness , tingling Psychiatric: No significant anxiety , depression, insomnia, anorexia Endocrine: No change in hair/nails, excessive thirst, excessive hunger, excessive urination  Hematologic/lymphatic: No significant bruising, lymphadenopathy,abnormal bleeding Allergy/immunology: No itchy/ watery eyes, significant sneezing, urticaria, angioedema  Physical exam:  Pertinent or positive findings: Pupils are pinpoint. She has isolated caries. There is an asymmetrically enlarged molar in the left maxillary area. Grade 1/2 systolic murmur is present. There is a loud right carotid bruit. She had minor rales at the bases. Lower abdomen is protuberant but nontender. Pedal pulses are decreased. She had dry , somewhat rough, slightly erythematous dermatitis diffusely over trunk and extremities. Some edema of the lower sternum is is suggested. She had difficulty following commands for me. When I asked her to raise her R thigh against opposition , she grabbed my arm & began to lift it. Her performance improved later as documented by the nurse practitioner student.  General appearance:Adequately nourished; no acute distress , increased work of breathing is present.   Lymphatic: No lymphadenopathy about the head, neck, axilla . Eyes: No conjunctival inflammation or lid edema is present. There is no scleral icterus. Ears:  External ear exam shows no significant lesions or deformities.   Nose:  External nasal examination shows no deformity or inflammation. Nasal mucosa are pink and moist without lesions ,exudates Oral exam: lips and gums are healthy appearing.There is no  oropharyngeal erythema or exudate . Neck:  No thyromegaly, masses, tenderness noted.    Heart:  Normal rate and regular rhythm. S1 and S2 normal without gallop, click, rub .  Lungs:without wheezes, rhonchi, rubs. Abdomen:Bowel sounds are normal. Abdomen is soft and nontender with no organomegaly, hernias,masses. GU: deferred  Extremities:  No cyanosis, clubbing,edema  Neurologic exam : Strength equal  in upper & lower extremities Balance,Rhomberg,finger to nose testing could not be completed due to clinical state Deep tendon reflexes are equal Skin: Warm & dry w/o tenting.  See summary under each active problem in the Problem List with associated updated therapeutic plan

## 2017-09-01 NOTE — Assessment & Plan Note (Addendum)
Acetaminophen dose will be adjusted to 500 mg every 6 hours as needed for temp or pain

## 2017-09-01 NOTE — Patient Instructions (Signed)
See assessment and plan under each diagnosis in the problem list and acutely for this visit 

## 2017-09-01 NOTE — Assessment & Plan Note (Signed)
Add Plavix prophylactically

## 2017-09-02 ENCOUNTER — Encounter: Payer: Self-pay | Admitting: Internal Medicine

## 2017-09-08 ENCOUNTER — Ambulatory Visit (HOSPITAL_COMMUNITY): Payer: Medicare Other

## 2017-09-13 ENCOUNTER — Ambulatory Visit (HOSPITAL_COMMUNITY): Payer: Medicare Other

## 2017-09-13 ENCOUNTER — Encounter (HOSPITAL_COMMUNITY): Payer: Self-pay

## 2017-09-13 ENCOUNTER — Telehealth: Payer: Self-pay

## 2017-09-13 ENCOUNTER — Encounter: Payer: Self-pay | Admitting: Internal Medicine

## 2017-09-13 ENCOUNTER — Non-Acute Institutional Stay (SKILLED_NURSING_FACILITY): Payer: Medicare Other | Admitting: Internal Medicine

## 2017-09-13 ENCOUNTER — Ambulatory Visit (HOSPITAL_COMMUNITY)
Admission: RE | Admit: 2017-09-13 | Discharge: 2017-09-13 | Disposition: A | Discharge status: Home / Self Care | Payer: Medicare Other | Source: Ambulatory Visit | Attending: Internal Medicine | Admitting: Internal Medicine

## 2017-09-13 DIAGNOSIS — I517 Cardiomegaly: Secondary | ICD-10-CM | POA: Diagnosis not present

## 2017-09-13 DIAGNOSIS — F0390 Unspecified dementia without behavioral disturbance: Secondary | ICD-10-CM

## 2017-09-13 DIAGNOSIS — M4856XA Collapsed vertebra, not elsewhere classified, lumbar region, initial encounter for fracture: Secondary | ICD-10-CM | POA: Diagnosis not present

## 2017-09-13 DIAGNOSIS — R1900 Intra-abdominal and pelvic swelling, mass and lump, unspecified site: Secondary | ICD-10-CM | POA: Diagnosis not present

## 2017-09-13 DIAGNOSIS — M4854XA Collapsed vertebra, not elsewhere classified, thoracic region, initial encounter for fracture: Secondary | ICD-10-CM | POA: Diagnosis not present

## 2017-09-13 DIAGNOSIS — N1330 Unspecified hydronephrosis: Secondary | ICD-10-CM

## 2017-09-13 DIAGNOSIS — I714 Abdominal aortic aneurysm, without rupture: Secondary | ICD-10-CM | POA: Insufficient documentation

## 2017-09-13 DIAGNOSIS — I700 Atherosclerosis of aorta: Secondary | ICD-10-CM | POA: Diagnosis not present

## 2017-09-13 DIAGNOSIS — I868 Varicose veins of other specified sites: Secondary | ICD-10-CM | POA: Diagnosis not present

## 2017-09-13 DIAGNOSIS — R918 Other nonspecific abnormal finding of lung field: Secondary | ICD-10-CM | POA: Insufficient documentation

## 2017-09-13 DIAGNOSIS — I2510 Atherosclerotic heart disease of native coronary artery without angina pectoris: Secondary | ICD-10-CM | POA: Insufficient documentation

## 2017-09-13 LAB — BASIC METABOLIC PANEL
BUN: 29 — AB (ref 4–21)
CREATININE: 0.8 (ref 0.5–1.1)
Glucose: 125
Potassium: 4.5 (ref 3.4–5.3)
Sodium: 136 — AB (ref 137–147)

## 2017-09-13 MED ORDER — IOPAMIDOL (ISOVUE-300) INJECTION 61%
INTRAVENOUS | Status: AC
  Administered 2017-09-13: 100 mL
  Filled 2017-09-13: qty 100

## 2017-09-13 NOTE — Patient Instructions (Addendum)
See assessment and plan under each diagnosis in the problem list and acutely for this visit Total time 32  minutes; greater than 50% of the visit spent counseling patient's POA  and coordinating care for problems addressed at this encounter

## 2017-09-13 NOTE — Assessment & Plan Note (Signed)
Her dementia is somewhat of a blessing in view of the wide spread of ovarian cancer of which she is unaware.

## 2017-09-13 NOTE — Progress Notes (Signed)
    NURSING HOME LOCATION:  [facility] ROOM NUMBER:  [id number]  CODE STATUS:  DNR  PCP:  Clovis RileyMitchell, L.August Saucerean, MD  [address] [place] [id number]  This is a nursing facility follow up for specific acute issue of abnormal CT of abd/pelvis.  Interim medical record and care [facility] was updated with review of diagnostic studies and change in clinical status since last visit were documented.  HPI: While hospitalized 1/15-1/19/19 with TIA she was found to have acute kidney injury. This did improve with IV hydration. Renal ultrasound revealed right hydronephrosis with an incidental finding of a large cystic structure posterior to the bladder. Cystic neoplasm in the pelvis cannot be excluded. CT of the abdomen and pelvis was recommended. The discharge summary states this was discussed with the patient's sister and aggressive intervention was declined. CT of abdomen and pelvis was not done for this reason. Outpatient follow-up with Urology was to be considered. The situation was discussed with her sister who is her power of attorney. For clarification of the process she wished to have a CT performed. This was completed today 2/19 with findings of a 14.6 x  9 x 15.8 cystic mass appears to originate from the right ovary suggesting primary malignancy. This compresses the uterus and bladder with displacement to the left. Surrounding bowels also displaced. There is slight impression upon the right ureter with minimal right renal pelvis dilation. Peri hepatic fluid with slightly lobulated contour is present suggesting gelatinous spread of the tumor.  Review of systems: Dementia invalidated responses. She was unable to tell me that she had gone for the CT scan. She states that she went to [facility]" and she had no inset of what had been performed. She denies any active abdominal or genitourinary symptoms.   Gastrointestinal: No heartburn, dysphagia, abdominal pain,  nausea /vomiting, rectal bleeding, melena, change in bowels Genitourinary: No dysuria, hematuria, pyuria, incontinence, nocturia   Physical exam:  Pertinent or positive findings: She exhibits frustration being unable to provide history. Faciess are blank when not engaged. Right basilar grade 1 systolic murmur present. The breath sounds are decreased. She has ill-defined mass in the suprapubic area. There is no tenderness to exam surprisingly. The pedal pulses are decreased. OA changes are present in the hands.  General appearance: Adequately nourished; no acute distress, increased work of breathing is present.   Lymphatic: No lymphadenopathy about the head, neck, axilla. Eyes: No conjunctival inflammation or lid edema is present. There is no scleral icterus. Neck:  No thyromegaly, masses, tenderness noted.    Heart:  Normal rate and regular rhythm. S1 and S2 normal without gallop, click, rub .  Lungs: without wheezes, rhonchi,rales , rubs. Abdomen:Bowel sounds are normal. Abdomen is soft and nontender with no organomegaly, hernias. GU: deferred  Extremities:  No cyanosis, clubbing,edema  Skin: Warm & dry w/o tenting.  See summary under each active problem in the Problem List with associated updated therapeutic plan

## 2017-09-13 NOTE — Telephone Encounter (Signed)
A CT call report was called into the office for patient.   Impression #1: 14.6 x 9 x 15.8 cm cystic mass in pelvis. Appears to have originated from right ovary. Worrisome for malignancy.   Impression #2: Perihepatic fluid with slightly lobulated contour. Fluid may be related to ovarian malignancy or gelatinous spread of tumor.

## 2017-09-13 NOTE — Assessment & Plan Note (Addendum)
09/13/17 results of CT discussed with sister via 3 separate phone calls; no bx requested after conferring with other family  DNR with Hospice when needed

## 2017-09-14 ENCOUNTER — Encounter: Payer: Self-pay | Admitting: Internal Medicine

## 2017-09-23 LAB — HEPATIC FUNCTION PANEL
ALT: 26 (ref 7–35)
AST: 29 (ref 13–35)
Alkaline Phosphatase: 148 — AB (ref 25–125)
BILIRUBIN, TOTAL: 0.6

## 2017-09-23 LAB — CBC AND DIFFERENTIAL
HCT: 29 — AB (ref 36–46)
Hemoglobin: 9.6 — AB (ref 12.0–16.0)
NEUTROS ABS: 7
PLATELETS: 192 (ref 150–399)
WBC: 10.5

## 2017-09-23 LAB — BASIC METABOLIC PANEL
BUN: 56 — AB (ref 4–21)
Creatinine: 1.3 — AB (ref 0.5–1.1)
Glucose: 110
Potassium: 4.3 (ref 3.4–5.3)
Sodium: 141 (ref 137–147)

## 2017-09-26 ENCOUNTER — Other Ambulatory Visit (HOSPITAL_COMMUNITY): Payer: Self-pay | Admitting: Internal Medicine

## 2017-09-26 DIAGNOSIS — R1310 Dysphagia, unspecified: Secondary | ICD-10-CM

## 2017-09-26 LAB — CBC AND DIFFERENTIAL
HEMATOCRIT: 33 — AB (ref 36–46)
Hemoglobin: 10.6 — AB (ref 12.0–16.0)
NEUTROS ABS: 5
PLATELETS: 184 (ref 150–399)
WBC: 9.3

## 2017-09-26 LAB — BASIC METABOLIC PANEL
BUN: 43 — AB (ref 4–21)
Creatinine: 1.1 (ref 0.5–1.1)
GLUCOSE: 85
Potassium: 4.3 (ref 3.4–5.3)
SODIUM: 148 — AB (ref 137–147)

## 2017-10-06 ENCOUNTER — Ambulatory Visit (HOSPITAL_COMMUNITY): Payer: Medicare Other

## 2017-10-06 ENCOUNTER — Non-Acute Institutional Stay (SKILLED_NURSING_FACILITY): Payer: Medicare Other | Admitting: Adult Health

## 2017-10-06 ENCOUNTER — Encounter: Payer: Self-pay | Admitting: Adult Health

## 2017-10-06 ENCOUNTER — Other Ambulatory Visit (HOSPITAL_COMMUNITY): Payer: Medicare Other

## 2017-10-06 ENCOUNTER — Encounter (HOSPITAL_COMMUNITY): Payer: Self-pay

## 2017-10-06 DIAGNOSIS — I10 Essential (primary) hypertension: Secondary | ICD-10-CM

## 2017-10-06 DIAGNOSIS — G459 Transient cerebral ischemic attack, unspecified: Secondary | ICD-10-CM

## 2017-10-06 DIAGNOSIS — R29898 Other symptoms and signs involving the musculoskeletal system: Secondary | ICD-10-CM | POA: Diagnosis not present

## 2017-10-06 DIAGNOSIS — F0390 Unspecified dementia without behavioral disturbance: Secondary | ICD-10-CM

## 2017-10-06 DIAGNOSIS — R630 Anorexia: Secondary | ICD-10-CM

## 2017-10-06 DIAGNOSIS — R1900 Intra-abdominal and pelvic swelling, mass and lump, unspecified site: Secondary | ICD-10-CM | POA: Diagnosis not present

## 2017-10-06 NOTE — Progress Notes (Signed)
Location:  [facility] Room Number: 104-A Place of Service:  SNF (31) Provider:  Kenard Gower, NP  Patient Care Team: Clovis Riley, L.August Saucer, MD as PCP - General (Family Medicine)  Extended Emergency Contact Information Primary Emergency Contact: Dew,[name] Address: [address]          [place], [place] [address] [place] Home Phone: [phone] Relation: Sister  Code Status:  DNR  Goals of care: Advanced Directive information Advanced Directives 09/13/2017  Does Patient Have a Medical Advance Directive? Yes  Type of Advance Directive Out of facility DNR (pink MOST or yellow form)  Does patient want to make changes to medical advance directive? No - Patient declined  Copy of Healthcare Power of Attorney in Chart? No - copy requested     Chief Complaint  Patient presents with  . Medical Management of Chronic Issues    Routine [facility] visit, previously was under the care of [facility]    HPI:  Pt is a 84 y.o. female seen today for medical management of chronic diseases. She was previously a short-term rehabilitation resident under the care of [facility], but is now a long-term care resident.  She has a PMH of TIA, stroke, macular degeneration, dyslipidemia, DM, history of atrial fibrillation, GERD, and depression.    She was admitted to [facility] on 08/13/17 from the hospital with right lower extremity weakness. She has been having recurrent falls at home and sister has noticed her right leg has been weaker. She has dementia. She had creatinine of 1. 88 in the hospital and was given IV fluids. Latest creatinine, dated 09/26/17, 1.09 with GFR 44.06. CT scan of the brain without abnormalities. EKG showed sinus rhythm. MR showed atrophy and chronic ischemia. It was thought that some of her symptoms could have been exacerbated by UTI. She completed treatment with Vantin. She was noted to have a large cystic  structure posterior to the bladder. Cystic neoplasm in the pelvis cannot be excluded. CT of abdomen and pelvis needed to further characterize. Sister does not want any aggressive treatment so CT of abdomen and pelvis was not done in hospital.  Care plan meeting done today, attended by 2 nieces, sister, Child psychotherapist and. NP. They asked why she is not moving her RUE and RLE. Discussed result of MRI of brain done on 08/10/17 which showed atrophy and chronic ischemia with no acute abnormality. CT head done on 08/10/17 showed no acute intracranial hemorrhage. Dr. Alwyn Ren has recently ordered CT abdomen and pelvis on 09/13/17 showed 14.6 X 9 X 15.8 cm cystic mass in the pelvis appears to be originating from the right ovary and is worrisome for malignancy. Sister has requested for Palliative consult and verbalized not wanting aggressive treatments. Sister thinks that patient would not want a feeding tube. Sister knows that she has dementia and informed them that the last stage of the disease is forgetting to eat. It was reported that she had weight loss of 5.24% or 7.8 lbs in 11 days. Niece has reported that patient used to live in [place] wherein she was living by herself. She had fell down and stepdaughter have to go through the window to get in the house. It was then that they decided to take her to [facility]. 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 She  has a biological son who lives in [place]. She has a brother who lives here in  [place], [place] who they thought has dementia. Patient lived with her sister for 10 years before being hospitalized. The meeting lasted for 46 minutes.     Past Medical History:  Diagnosis Date  . Acid reflux   . Atrial fibrillation (HCC)   . Depression   . Diabetes mellitus   . High cholesterol   . Hypertension   . Irregular heart rate   . Macular degeneration   . Stroke (HCC)   . TIA (transient ischemic attack)    History reviewed. No pertinent  surgical history.  No Known Allergies  Outpatient Encounter Medications as of 10/06/2017  Medication Sig  . acetaminophen (TYLENOL) 500 MG tablet Take 500 mg by mouth every 6 (six) hours as needed for mild pain or fever.  Marland Kitchen aspirin 81 MG tablet Take 81 mg by mouth daily.  . Calcium Carbonate-Vit D-Min (CALCIUM 1200 PO) Take 1,200 mg by mouth daily.  . clopidogrel (PLAVIX) 75 MG tablet Take 75 mg by mouth daily.  Marland Kitchen ipratropium-albuterol (DUONEB) 0.5-2.5 (3) MG/3ML SOLN Take 3 mLs by nebulization every 6 (six) hours as needed.  Marland Kitchen losartan (COZAAR) 50 MG tablet Take 50 mg by mouth daily.   . Multiple Vitamins-Minerals (PRESERVISION AREDS PO) Take 1 tablet by mouth daily.   Marland Kitchen NUTRITIONAL SUPPLEMENT LIQD Take 120 mLs by mouth 2 (two) times daily. MedPass  . Nutritional Supplements (NUTRITIONAL SUPPLEMENT PO) Take 1 each by mouth daily. Magic Cup  . ranitidine (ZANTAC) 150 MG tablet Take 150 mg by mouth 2 (two) times daily.   No facility-administered encounter medications on file as of 10/06/2017.     Review of Systems  Unable to obtain due to dementia.    Immunization History  Administered Date(s) Administered  . Influenza, High Dose Seasonal PF 08/11/2017  . Pneumococcal-Unspecified 04/25/2014   Pertinent  Health Maintenance Due  Topic Date Due  . DEXA SCAN  10/11/2017 (Originally 05/12/1990)  . PNA vac Low Risk Adult (2 of 2 - PCV13) 10/07/2018 (Originally 04/26/2015)  . INFLUENZA VACCINE  Completed      Vitals:   10/06/17 1118  BP: 121/66  Pulse: 79  Resp: 20  Temp: 98.6 F (37 C)  TempSrc: Oral  SpO2: 97%  Weight: 135 lb 9.6 oz (61.5 kg)  Height: 5\' 2"  (1.575 m)   Body mass index is 24.8 kg/m.  Physical Exam  GENERAL APPEARANCE: Well nourished. In no acute distress. Normal body habitus SKIN:  Skin is warm and dry.  MOUTH and THROAT: Lips are without lesions. Oral mucosa is moist and without lesions. Tongue is normal in shape, size, and color and without  lesions RESPIRATORY: Breathing is even & unlabored, BS CTAB CARDIAC: RRR, no murmur,no extra heart sounds, no edema GI: Abdomen soft, normal BS, no masses, no tenderness EXTREMITIES:  Not able to move RUE and RLE PSYCHIATRIC: Alert to self, disoriented to time and place. Affect and behavior are appropriate   Labs reviewed: Recent Labs    08/09/17 1413 08/10/17 0612 08/11/17 0629 08/17/17 09/13/17 09/26/17  NA 144 142 143 144 136* 148*  K 2.9* 3.8 3.4* 4.0 4.5 4.3  CL 110 111 115*  --   --   --   CO2 26 23 23   --   --   --   GLUCOSE 93 88 86  --   --   --   BUN 25* 27* 25* 26* 29* 43*  CREATININE 1.88* 1.36* 0.91 0.8 0.8 1.1  CALCIUM 8.0* 7.8* 7.7*  --   --   --  Recent Labs    08/10/17 0612  AST 91*  ALT 39  ALKPHOS 99  BILITOT 1.0  PROT 5.3*  ALBUMIN 2.7*   Recent Labs    08/09/17 1413 08/10/17 0612 09/26/17  WBC 11.9* 10.2 9.3  NEUTROABS 8.7*  --  5  HGB 12.4 11.9* 10.6*  HCT 37.1 36.2 33*  MCV 88.3 89.6  --   PLT 158 143* 184    Lab Results  Component Value Date   HGBA1C 4.6 (L) 08/10/2017   Lab Results  Component Value Date   CHOL 121 08/10/2017   HDL 32 (L) 08/10/2017   LDLCALC 70 08/10/2017   TRIG 96 08/10/2017   CHOLHDL 3.8 08/10/2017    Significant Diagnostic Results in last 30 days:  Ct Abdomen Pelvis W Contrast  Result Date: 09/13/2017 CLINICAL DATA:  84 year old female with mild right-sided hydronephrosis and abnormal ultrasound. Subsequent encounter. EXAM: CT ABDOMEN AND PELVIS WITH CONTRAST TECHNIQUE: Multidetector CT imaging of the abdomen and pelvis was performed using the standard protocol following bolus administration of intravenous contrast. CONTRAST:  ISOVUE-300 IOPAMIDOL (ISOVUE-300) INJECTION 61% COMPARISON:  08/10/2017 renal sonogram. FINDINGS: Lower chest: Questionable 5 mm nodule right middle lobe versus lymph node in fissure (series 5, image 1). Basilar subsegmental atelectatic changes. Cardiomegaly. Mitral and aortic  valve calcification. Three vessel coronary artery calcification. Hepatobiliary: Perihepatic fluid with slightly lobulated contour. This fluid may be related to ovarian malignancy/gelatinous spread of tumor. No focal intrahepatic lesion. No calcified gallstone. Pancreas: No worrisome pancreatic mass or inflammation. Spleen: No splenic mass or enlargement. Adrenals/Urinary Tract: Minimal fullness right renal pelvis may be secondary to mild impression upon the right ureter by ovarian mass. No worrisome renal or adrenal lesion. Compressed urinary bladder without gross abnormality. Lack of contrast filling limits evaluation. Stomach/Bowel: Sigmoid colon compressed by ovarian mass. Diverticulosis with muscular hypertrophy sigmoid colon and distal descending colon without evidence of inflammation. Partially under distended stomach without gross abnormality. Vascular/Lymphatic: Perisplenic varices. Splenic vein although small is patent. Atherosclerotic changes aorta with lower abdominal aneurysm measuring up to 3.6 cm. Dilated right common iliac artery measuring up to 1.9 cm. Atherosclerotic changes aortic branch vessels without large vessel occlusion. Scattered normal size lymph nodes without adenopathy. Reproductive: 14.6 x 9 x 15.8 cm cystic mass in the pelvis appears to be originating from the right ovary as gonadal vessels extend into this structure. This is worrisome for malignancy. This compresses the uterus which is displaced to left. Compression and displacement of the urinary bladder to left. Displacement of surrounding bowel. Other: Fluid along the right iliopsoas muscle without source identified. Third spacing of fluid. Musculoskeletal: Remote T8 mild superior endplate compression fracture with 15% loss of height. Remote T12 superior endplate compression fracture with 50% loss of height and mild retropulsion with mild kyphosis centered at this level. Narrowing ventral thecal sac with cord displaced posteriorly.  Remote L2 superior endplate compression fracture with 30% loss of height. Prominent facet degenerative changes L4-5 with mild anterior slip. No osseous destructive lesion. IMPRESSION: 14.6 x 9 x 15.8 cm cystic mass in the pelvis appears to be originating from the right ovary and is worrisome for malignancy. This compresses the uterus and bladder which are displaced to the left. Displacement of surrounding bowel. Slight impression upon the right ureter with minimal right renal pelvis fullness. Perihepatic fluid with slightly lobulated contour. This fluid may be related to ovarian malignancy/gelatinous spread of tumor. Questionable 5 mm nodule right middle lobe versus lymph node in fissure (series 5,  image 1). Cardiomegaly.  Three vessel coronary artery calcification. Aortic Atherosclerosis (ICD10-I70.0). 3.6 cm lower abdominal aortic aneurysm. Right common iliac artery dilated to 1.9 cm. Recommend followup by ultrasound in 2 years. This recommendation follows ACR consensus guidelines: White Paper of the ACR Incidental Findings Committee II on Vascular Findings. J Am Coll Radiol 2013; 10:789-794. Perisplenic varices. Splenic vein although small is patent. Fluid along the right iliopsoas muscle without source identified. Remote thoracic and lumbar compression fractures and degenerative changes as detailed above. These results will be called to the ordering clinician or representative by the Radiologist Assistant, and communication documented in the PACS or zVision Dashboard. Electronically Signed   By: Lacy DuverneySteven  Olson M.D.   On: 09/13/2017 13:20    Assessment/Plan  1. Right leg weakness - right arm is not able to move as well, give supportive care, fall precautions   2. Pelvic mass - sister does not want aggressive treatment, will have Palliative consult with [facility]   3. Essential hypertension - well-controlled, continue Losartan 50 mg daily   4. TIA (transient ischemic attack) - continue ASA 81 mg daily and  Plavix 75 mg daily   5. Dementia without behavioral disturbance, unspecified dementia type - Namzaric was recently discontinued, will refer to palliative care   6. Poor appetite -  Had weight loss 5.24% or 7.8 lbs in  11 days, will start Eldertonic 15 ml BID      Family/ staff Communication: Discussed plan of care with sister, 2 nieces and Child psychotherapistsocial worker.  Labs/tests ordered:  None  Goals of care:   Short-term care   Kenard GowerMonina Medina-Vargas, NP [facility] [phone] (Monday-Friday 8:00 a.m. - 5:00 p.m.) [phone] (after hours)

## 2017-10-24 DEATH — deceased

## 2019-10-11 IMAGING — MR MR HEAD W/O CM
8 of 10 series · 37 of 48 positions shown · non-contrast
Comparison: CT head 08/09/2017

CLINICAL DATA: Confusion dementia.  Possible stroke

EXAM:
MRI HEAD WITHOUT CONTRAST
TECHNIQUE: Multiplanar, multiecho pulse sequences of the brain and surrounding
structures were obtained without intravenous contrast.

[Series 3: DWI · axial · 3.0mm · 1.09mm/px · z∈[-66,+90]mm · 8 of 106 slices shown (1 of 4)]
[im 1/106]
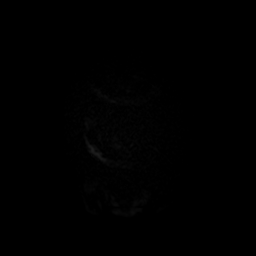
[im 12/106]
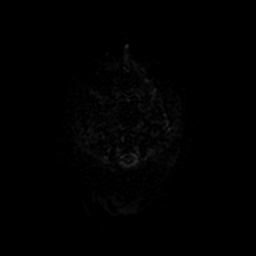
[im 36/106]
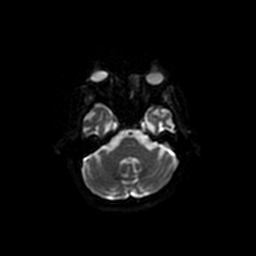
[im 47/106]
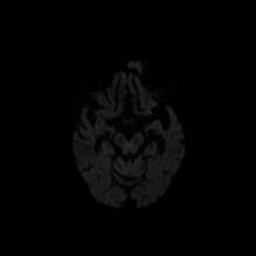
[im 59/106]
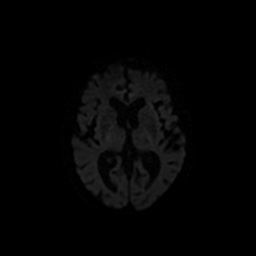
[im 71/106]
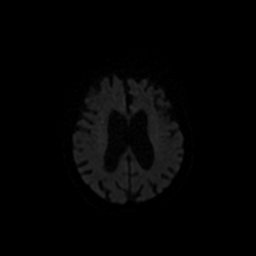
[im 94/106]
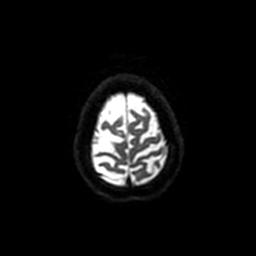
[im 106/106]
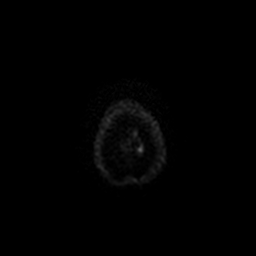

[Series 4: DWI · coronal · 5.0mm · 1.09mm/px · 7 of 70 slices shown (2 of 4)]
[im 1/70]
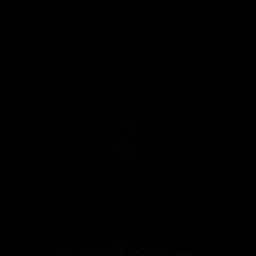
[im 12/70]
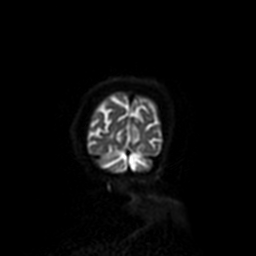
[im 24/70]
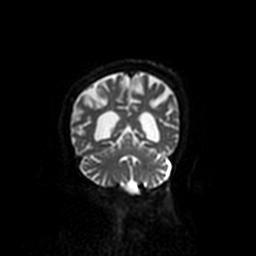
[im 35/70]
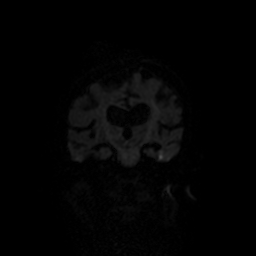
[im 47/70]
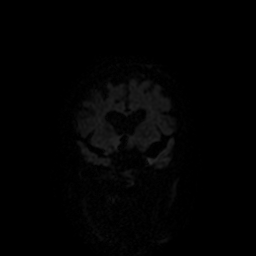
[im 58/70]
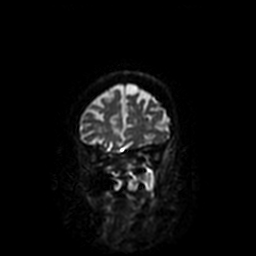
[im 70/70]
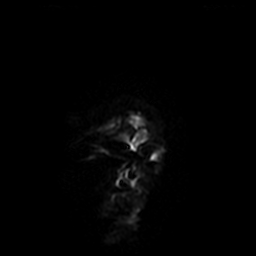

[Series 5: T2 · axial · 5.0mm · 0.43mm/px · z∈[-76,+86]mm · 3 of 28 slices shown (1 of 2)]
[im 1/28]
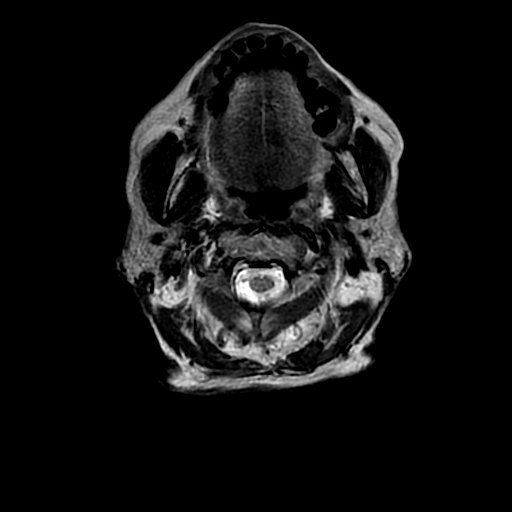
[im 14/28]
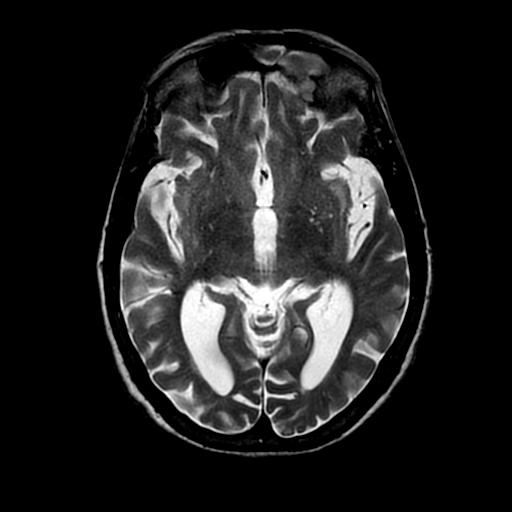
[im 28/28]
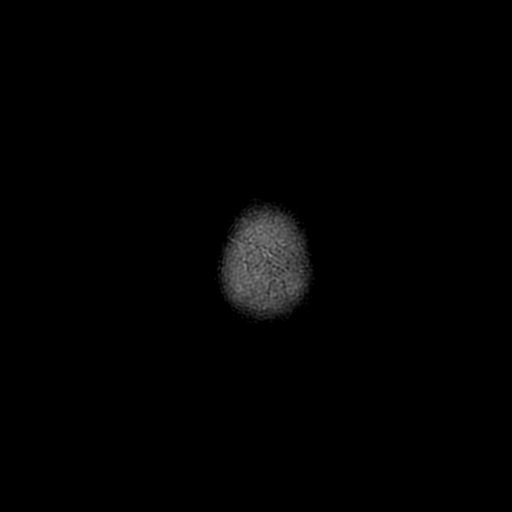

[Series 6: FLAIR · axial · 5.0mm · 0.43mm/px · z∈[-76,+86]mm · 3 of 28 slices shown]
[im 1/28]
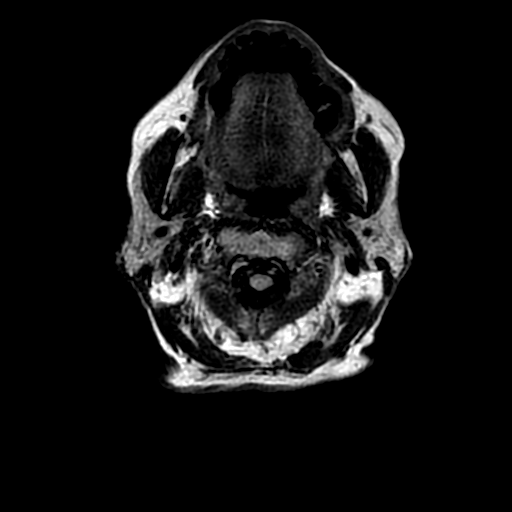
[im 14/28]
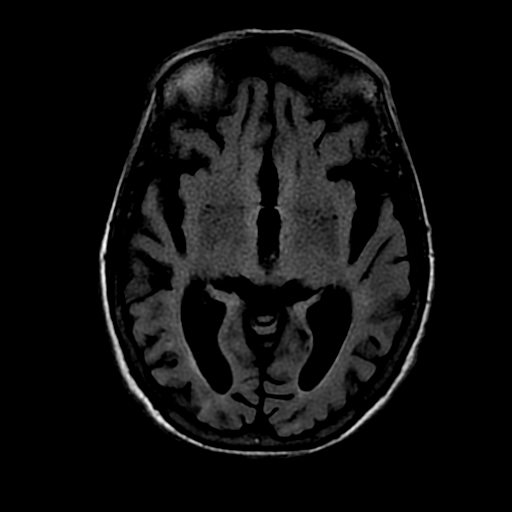
[im 28/28]
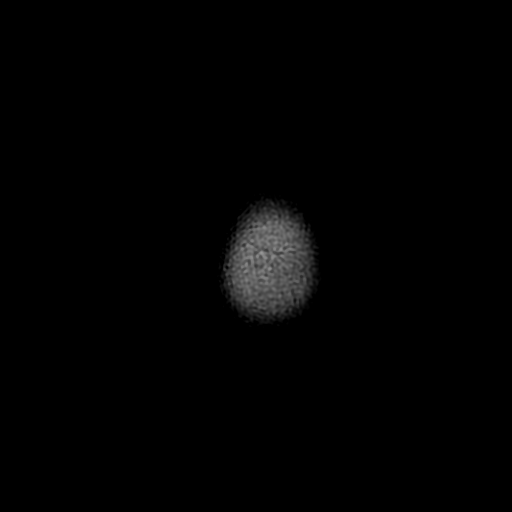

[Series 8: T1 · sagittal · 5.0mm · 0.47mm/px · 3 of 24 slices shown]
[im 1/24]
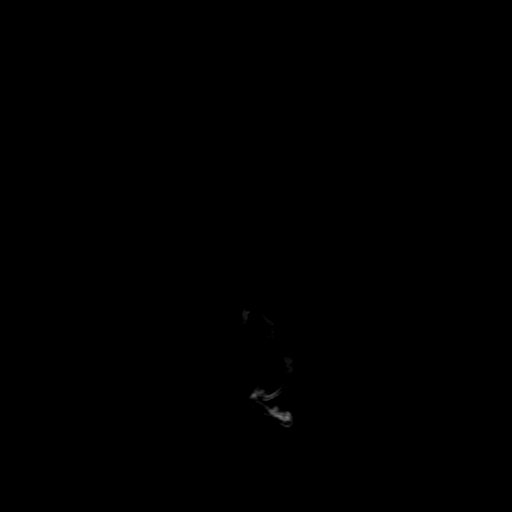
[im 12/24]
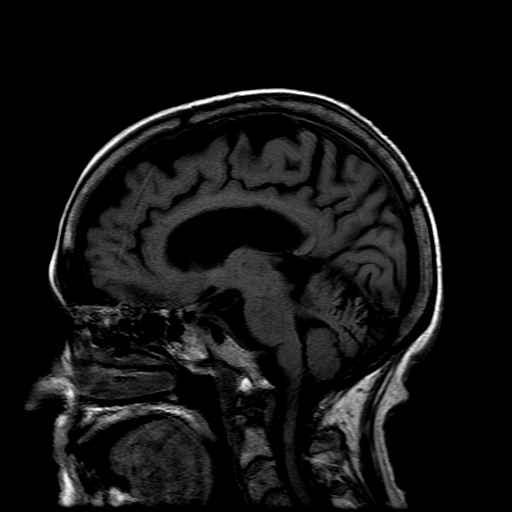
[im 24/24]
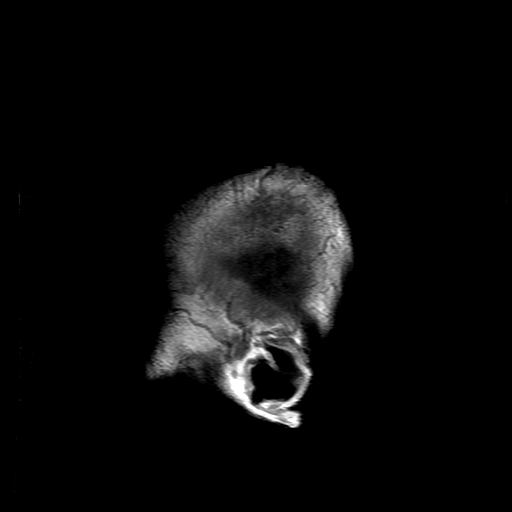

[Series 10: T2 · coronal · 5.0mm · 0.45mm/px · 3 of 27 slices shown (2 of 2)]
[im 1/27]
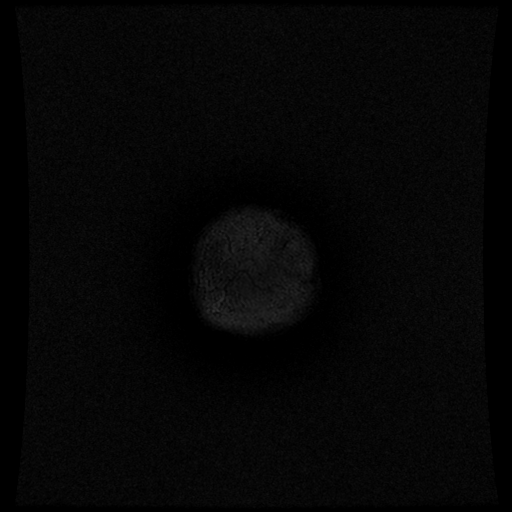
[im 14/27]
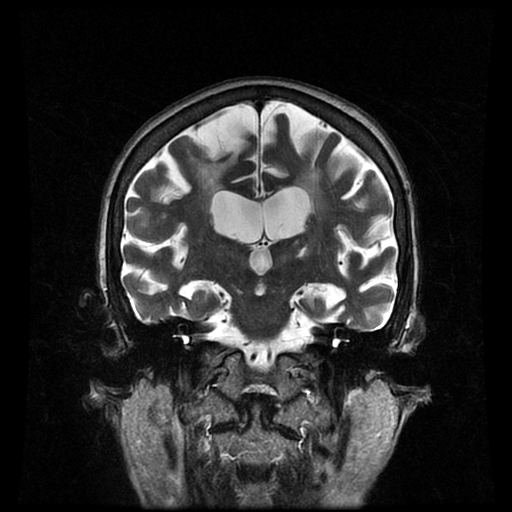
[im 27/27]
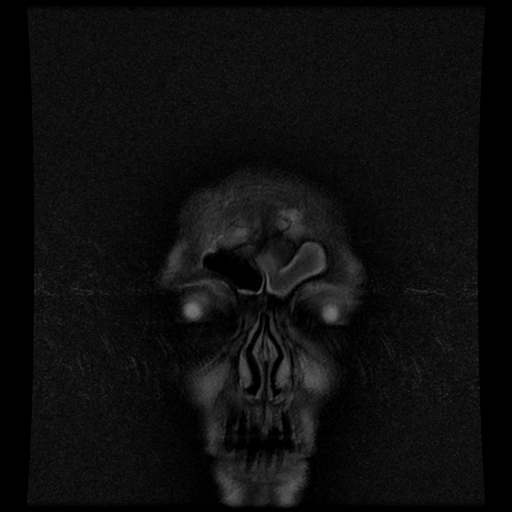

[Series 300: DWI · axial · 3.0mm · 1.09mm/px · z∈[-66,+90]mm · 6 of 53 slices shown (3 of 4)]
[im 1/53]
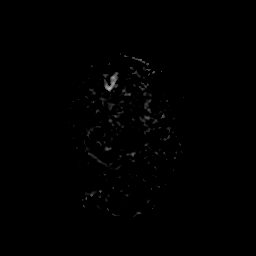
[im 11/53]
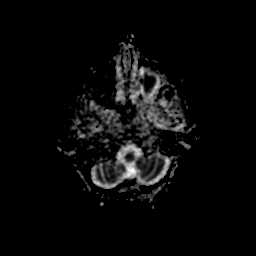
[im 21/53]
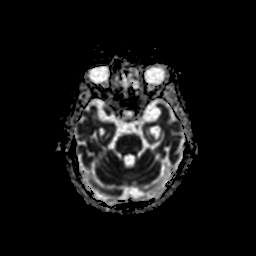
[im 32/53]
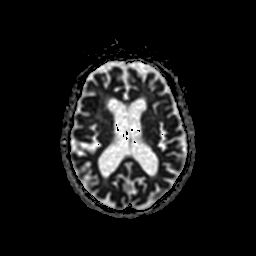
[im 42/53]
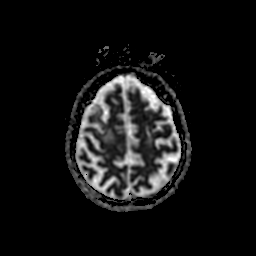
[im 53/53]
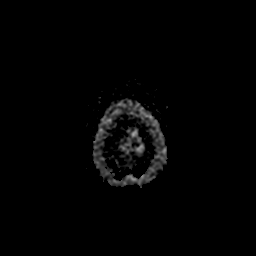

[Series 400: DWI · coronal · 5.0mm · 1.09mm/px · 4 of 35 slices shown (4 of 4)]
[im 1/35]
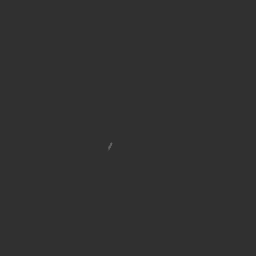
[im 12/35]
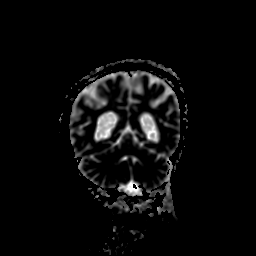
[im 23/35]
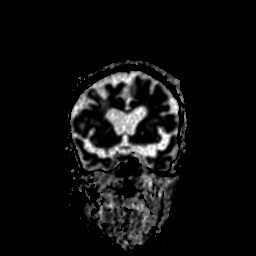
[im 35/35]
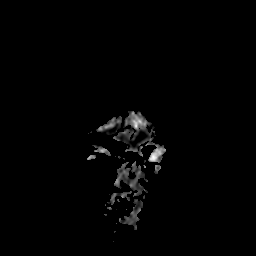

[37 of 48 positions shown; findings below may reference images not displayed]

FINDINGS: Brain: Moderate to advanced cerebral atrophy. Negative for acute
infarct. Chronic microvascular ischemic change in the white matter,
basal ganglia, thalamus, and pons bilaterally. Small chronic infarct
right frontal lobe over the convexity. Negative for hemorrhage or
mass. No midline shift.

Vascular: Normal arterial flow void

Skull and upper cervical spine: Negative

Sinuses/Orbits: Mucosal edema and retained secretions in the left
frontal ethmoid and maxillary sinus unchanged from CT yesterday.
Bilateral cataract removal.

Other: None
IMPRESSION: Atrophy and chronic ischemia.  No acute abnormality.

## 2020-01-02 IMAGING — US US RENAL
2 series · 14 of 25 positions shown · non-contrast
Comparison: None.

CLINICAL DATA: Renal failure

EXAM:
RENAL / URINARY TRACT ULTRASOUND COMPLETE

[Series 1: us renal · 0.23mm/px · 12 of 43 slices shown (1 of 2)]
[im 1/43]
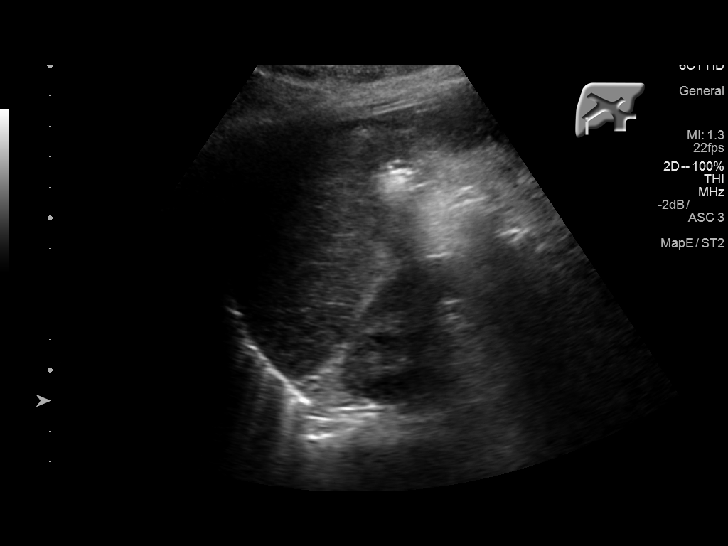
[im 5/43]
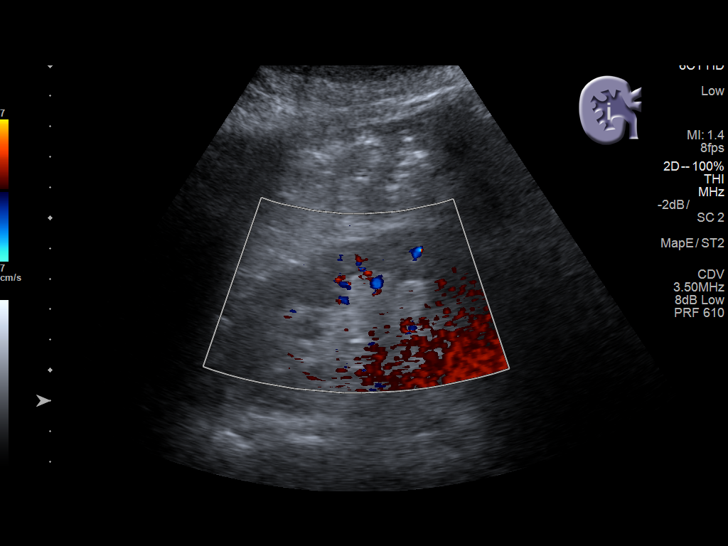
[im 9/43]
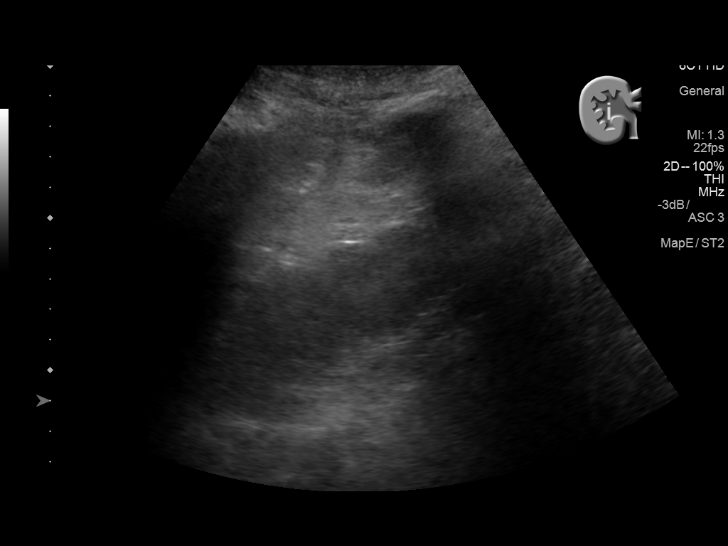
[im 13/43]
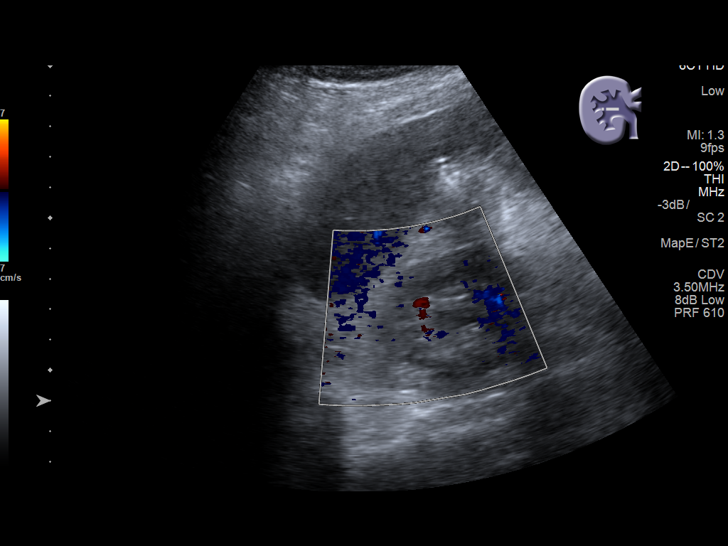
[im 17/43]
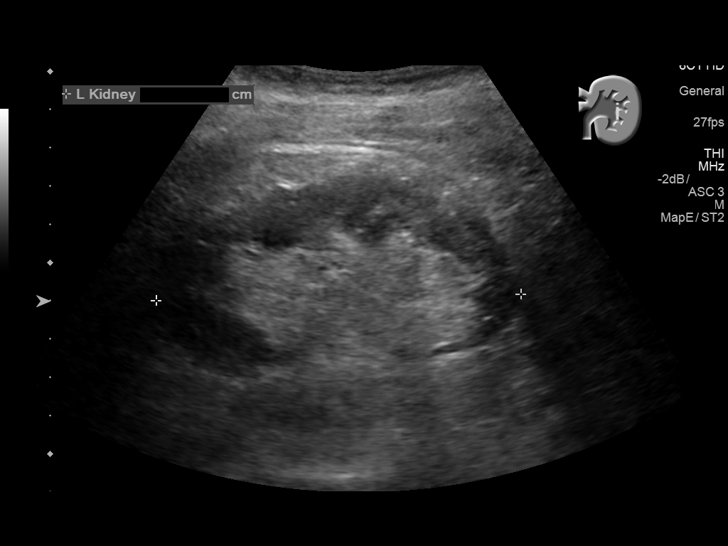
[im 19/43]
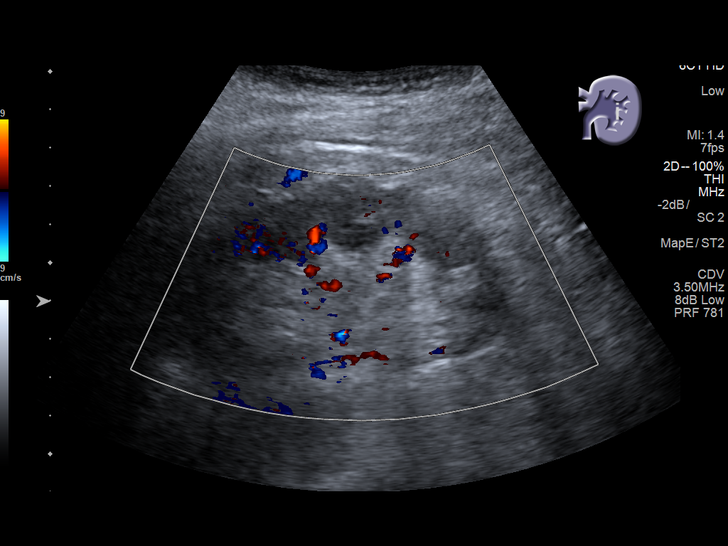
[im 23/43]
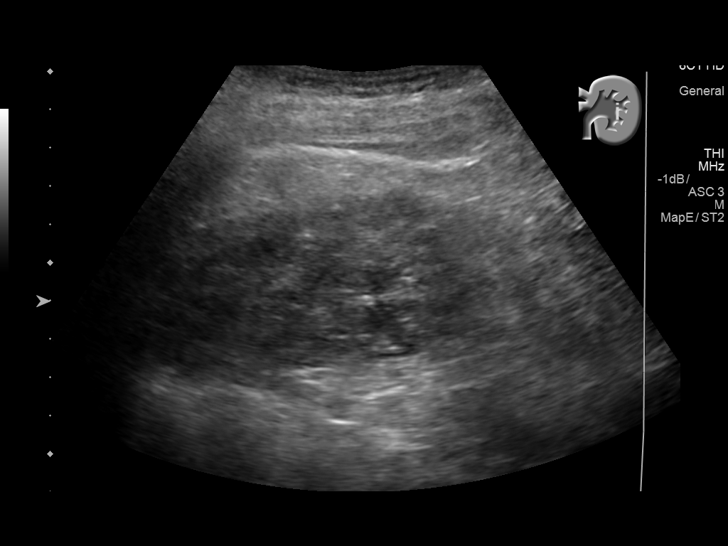
[im 27/43]
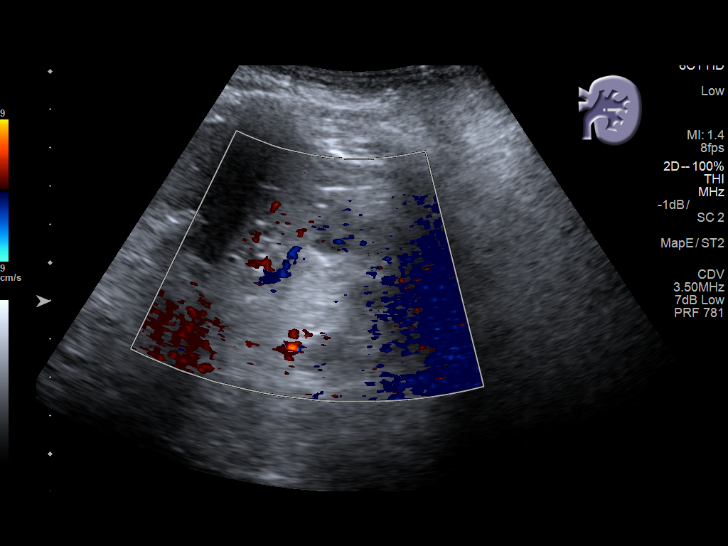
[im 31/43]
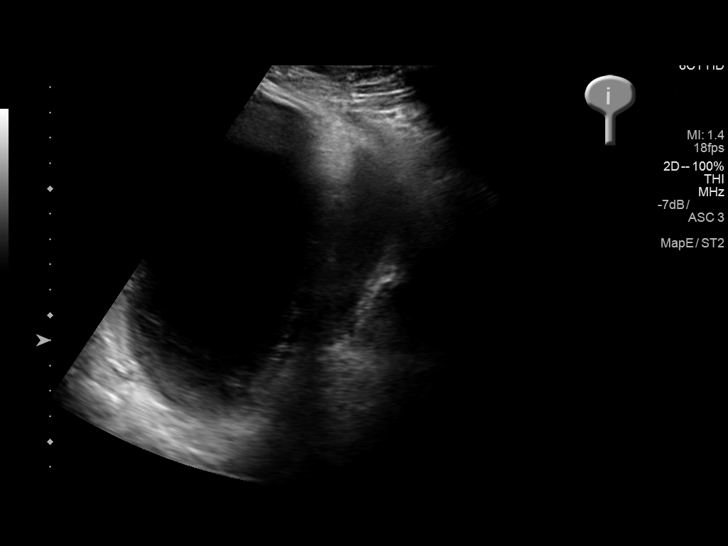
[im 33/43]
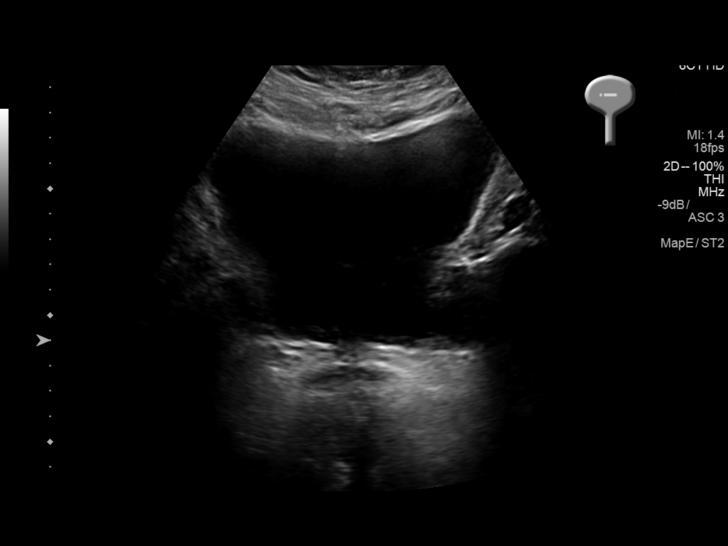
[im 37/43]
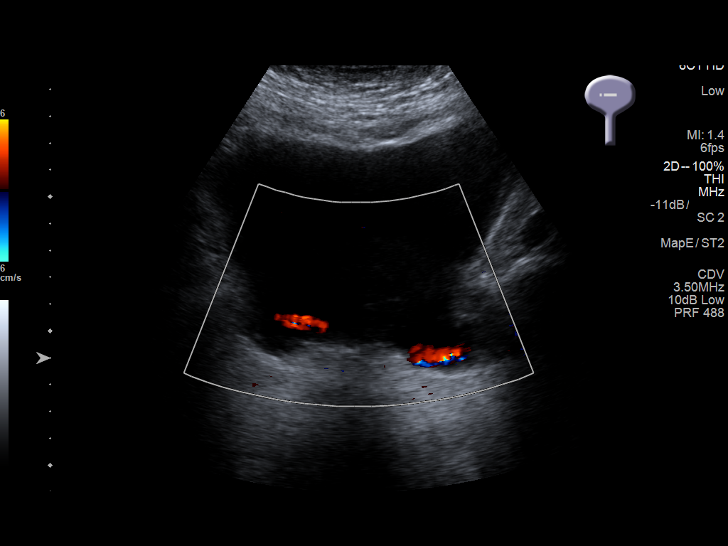
[im 41/43]
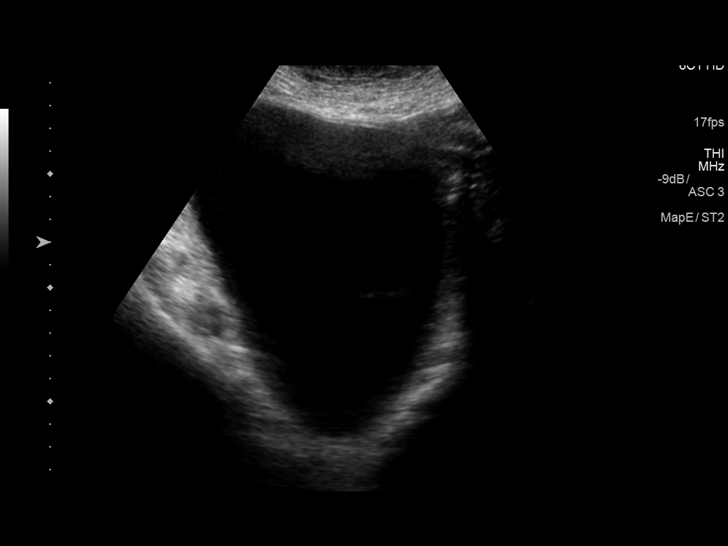

[Series 2001: us renal · 5 acquisitions, 2 frames shown (2 of 2)]
[im 1/5]
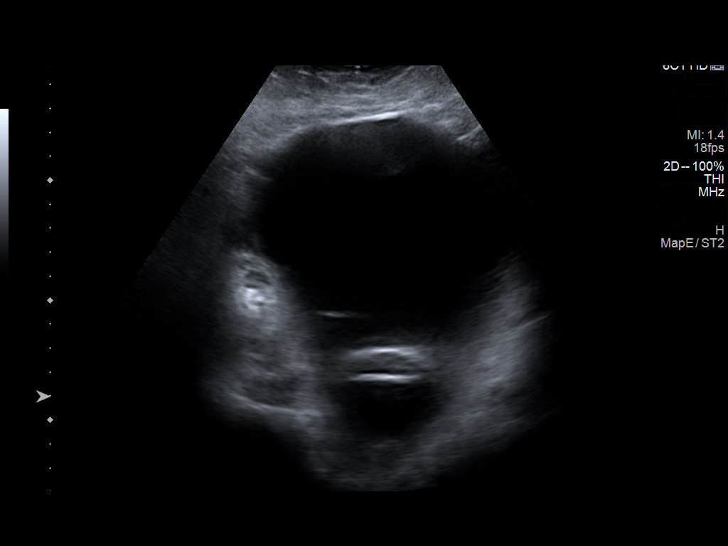
[im 5/5]
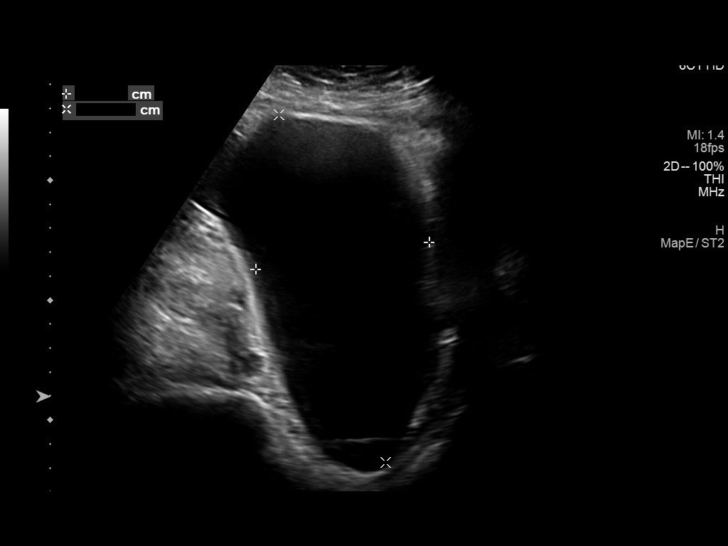

[14 of 25 positions shown; findings below may reference images not displayed]

FINDINGS: Right Kidney:

Length: 9.1 cm. Echogenicity within normal limits. No mass. Mild
right hydronephrosis.

Left Kidney:

Length: 9.5 cm. Echogenicity within normal limits. No mass or
hydronephrosis visualized.

Bladder:

The bladder is within normal limits. Bilateral ureteral jets are
identified. There is a cystic structure posterior to the bladder
measuring 7.3 x 15.2 x 11.4 cm.
IMPRESSION: Mild right hydronephrosis. No evidence of left hydronephrosis.
Bilateral ureteral jets are identified excluding ureteral
obstruction.

Large cystic structure is posterior to the bladder. Cystic neoplasm
in the pelvis cannot be excluded. Consider CT abdomen and pelvis to
further characterize.
